# Patient Record
Sex: Female | Born: 1965 | Race: White | Hispanic: No | Marital: Married | Smoking: Former smoker
Health system: Southern US, Community
[De-identification: ages and names within clinical notes are randomized; demographics above are authoritative.]

## PROBLEM LIST (undated history)

## (undated) DIAGNOSIS — E119 Type 2 diabetes mellitus without complications: Secondary | ICD-10-CM

## (undated) DIAGNOSIS — J40 Bronchitis, not specified as acute or chronic: Secondary | ICD-10-CM

## (undated) DIAGNOSIS — E079 Disorder of thyroid, unspecified: Secondary | ICD-10-CM

## (undated) DIAGNOSIS — N390 Urinary tract infection, site not specified: Secondary | ICD-10-CM

## (undated) DIAGNOSIS — I10 Essential (primary) hypertension: Secondary | ICD-10-CM

## (undated) DIAGNOSIS — N289 Disorder of kidney and ureter, unspecified: Secondary | ICD-10-CM

## (undated) DIAGNOSIS — H269 Unspecified cataract: Secondary | ICD-10-CM

## (undated) DIAGNOSIS — M069 Rheumatoid arthritis, unspecified: Secondary | ICD-10-CM

## (undated) DIAGNOSIS — K760 Fatty (change of) liver, not elsewhere classified: Secondary | ICD-10-CM

## (undated) HISTORY — PX: TUBAL LIGATION: SHX77

## (undated) HISTORY — DX: Unspecified cataract: H26.9

## (undated) HISTORY — PX: CATARACT EXTRACTION: SUR2

## (undated) HISTORY — DX: Type 2 diabetes mellitus without complications: E11.9

## (undated) HISTORY — DX: Fatty (change of) liver, not elsewhere classified: K76.0

## (undated) HISTORY — DX: Bronchitis, not specified as acute or chronic: J40

## (undated) HISTORY — PX: SHOULDER SURGERY: SHX246

---

## 2007-11-15 ENCOUNTER — Other Ambulatory Visit: Admission: RE | Admit: 2007-11-15 | Discharge: 2007-11-15 | Payer: Self-pay | Admitting: Gynecology

## 2009-03-14 ENCOUNTER — Ambulatory Visit: Payer: Self-pay | Admitting: Internal Medicine

## 2009-03-14 DIAGNOSIS — F17200 Nicotine dependence, unspecified, uncomplicated: Secondary | ICD-10-CM | POA: Insufficient documentation

## 2009-03-18 LAB — CONVERTED CEMR LAB
ALT: 8 units/L (ref 0–35)
Basophils Absolute: 0 10*3/uL (ref 0.0–0.1)
Basophils Relative: 0 % (ref 0–1)
Calcium: 9.6 mg/dL (ref 8.4–10.5)
Cholesterol: 188 mg/dL (ref 0–200)
Glucose, Bld: 90 mg/dL (ref 70–99)
HDL: 45 mg/dL (ref >39)
Hemoglobin: 13.5 g/dL (ref 12.0–15.0)
Lymphocytes Relative: 34 % (ref 12–46)
Monocytes Absolute: 0.7 10*3/uL (ref 0.1–1.0)
Monocytes Relative: 8 % (ref 3–12)
Neutro Abs: 5.1 10*3/uL (ref 1.7–7.7)
Neutrophils Relative %: 56 % (ref 43–77)
Potassium: 4.3 meq/L (ref 3.5–5.3)
RBC: 4.44 M/uL (ref 3.87–5.11)
RDW: 13.7 % (ref 11.5–15.5)
Sodium: 138 meq/L (ref 135–145)
Total CHOL/HDL Ratio: 4.2
VLDL: 25 mg/dL (ref 0–40)

## 2009-08-20 ENCOUNTER — Ambulatory Visit: Payer: Self-pay | Admitting: Internal Medicine

## 2009-08-20 DIAGNOSIS — R635 Abnormal weight gain: Secondary | ICD-10-CM | POA: Insufficient documentation

## 2012-02-01 ENCOUNTER — Other Ambulatory Visit: Payer: Self-pay | Admitting: Obstetrics and Gynecology

## 2012-02-01 ENCOUNTER — Other Ambulatory Visit (HOSPITAL_COMMUNITY)
Admission: RE | Admit: 2012-02-01 | Discharge: 2012-02-01 | Disposition: A | Discharge status: Home / Self Care | Payer: 59 | Source: Ambulatory Visit | Attending: Obstetrics and Gynecology | Admitting: Obstetrics and Gynecology

## 2012-02-01 DIAGNOSIS — Z1159 Encounter for screening for other viral diseases: Secondary | ICD-10-CM | POA: Insufficient documentation

## 2012-02-01 DIAGNOSIS — Z124 Encounter for screening for malignant neoplasm of cervix: Secondary | ICD-10-CM | POA: Insufficient documentation

## 2015-07-12 ENCOUNTER — Encounter (HOSPITAL_BASED_OUTPATIENT_CLINIC_OR_DEPARTMENT_OTHER): Payer: Self-pay | Admitting: Emergency Medicine

## 2015-07-12 ENCOUNTER — Emergency Department (HOSPITAL_BASED_OUTPATIENT_CLINIC_OR_DEPARTMENT_OTHER): Payer: 59

## 2015-07-12 ENCOUNTER — Emergency Department (HOSPITAL_BASED_OUTPATIENT_CLINIC_OR_DEPARTMENT_OTHER)
Admission: EM | Admit: 2015-07-12 | Discharge: 2015-07-12 | Disposition: A | Discharge status: Home / Self Care | Payer: 59 | Attending: Emergency Medicine | Admitting: Emergency Medicine

## 2015-07-12 DIAGNOSIS — M545 Low back pain: Secondary | ICD-10-CM | POA: Diagnosis present

## 2015-07-12 DIAGNOSIS — Z8639 Personal history of other endocrine, nutritional and metabolic disease: Secondary | ICD-10-CM | POA: Diagnosis not present

## 2015-07-12 DIAGNOSIS — Z3202 Encounter for pregnancy test, result negative: Secondary | ICD-10-CM | POA: Diagnosis not present

## 2015-07-12 DIAGNOSIS — R52 Pain, unspecified: Secondary | ICD-10-CM

## 2015-07-12 DIAGNOSIS — M79604 Pain in right leg: Secondary | ICD-10-CM

## 2015-07-12 HISTORY — DX: Disorder of kidney and ureter, unspecified: N28.9

## 2015-07-12 HISTORY — DX: Disorder of thyroid, unspecified: E07.9

## 2015-07-12 LAB — CBC WITH DIFFERENTIAL/PLATELET
Basophils Absolute: 0 10*3/uL (ref 0.0–0.1)
Basophils Relative: 0 % (ref 0–1)
EOS PCT: 1 % (ref 0–5)
Eosinophils Absolute: 0.1 10*3/uL (ref 0.0–0.7)
HCT: 38.5 % (ref 36.0–46.0)
Hemoglobin: 12.6 g/dL (ref 12.0–15.0)
LYMPHS ABS: 2.7 10*3/uL (ref 0.7–4.0)
LYMPHS PCT: 29 % (ref 12–46)
MCH: 29.5 pg (ref 26.0–34.0)
MCHC: 32.7 g/dL (ref 30.0–36.0)
MCV: 90.2 fL (ref 78.0–100.0)
Monocytes Absolute: 0.8 10*3/uL (ref 0.1–1.0)
Monocytes Relative: 9 % (ref 3–12)
NEUTROS ABS: 5.6 10*3/uL (ref 1.7–7.7)
Neutrophils Relative %: 61 % (ref 43–77)
PLATELETS: 288 10*3/uL (ref 150–400)
RBC: 4.27 MIL/uL (ref 3.87–5.11)
RDW: 13.6 % (ref 11.5–15.5)
WBC: 9.3 10*3/uL (ref 4.0–10.5)

## 2015-07-12 LAB — BASIC METABOLIC PANEL
ANION GAP: 9 (ref 5–15)
BUN: 13 mg/dL (ref 6–20)
CHLORIDE: 104 mmol/L (ref 101–111)
CO2: 25 mmol/L (ref 22–32)
Calcium: 9.2 mg/dL (ref 8.9–10.3)
Creatinine, Ser: 0.78 mg/dL (ref 0.44–1.00)
GFR calc Af Amer: >60 mL/min (ref >60)
GFR calc non Af Amer: >60 mL/min (ref >60)
GLUCOSE: 125 mg/dL — AB (ref 65–99)
Potassium: 3.6 mmol/L (ref 3.5–5.1)
SODIUM: 138 mmol/L (ref 135–145)

## 2015-07-12 LAB — URINALYSIS, ROUTINE W REFLEX MICROSCOPIC
BILIRUBIN URINE: NEGATIVE
GLUCOSE, UA: NEGATIVE mg/dL
Ketones, ur: NEGATIVE mg/dL
NITRITE: NEGATIVE
PH: 5.5 (ref 5.0–8.0)
Protein, ur: NEGATIVE mg/dL
SPECIFIC GRAVITY, URINE: 1.027 (ref 1.005–1.030)
Urobilinogen, UA: 0.2 mg/dL (ref 0.0–1.0)

## 2015-07-12 LAB — URINE MICROSCOPIC-ADD ON

## 2015-07-12 LAB — PREGNANCY, URINE: PREG TEST UR: NEGATIVE

## 2015-07-12 MED ORDER — METHOCARBAMOL 500 MG PO TABS: 1000.0000 mg | ORAL_TABLET | Freq: Four times a day (QID) | ORAL | Status: DC | PRN

## 2015-07-12 MED ORDER — MORPHINE SULFATE 4 MG/ML IJ SOLN
4.0000 mg | Freq: Once | INTRAMUSCULAR | Status: AC
  Administered 2015-07-12: 4 mg via INTRAMUSCULAR
  Filled 2015-07-12: qty 1

## 2015-07-12 MED ORDER — HYDROCODONE-ACETAMINOPHEN 5-325 MG PO TABS
2.0000 | ORAL_TABLET | Freq: Once | ORAL | Status: AC
  Administered 2015-07-12: 2 via ORAL
  Filled 2015-07-12: qty 2

## 2015-07-12 MED ORDER — OXYCODONE-ACETAMINOPHEN 5-325 MG PO TABS: ORAL_TABLET | ORAL | Status: DC

## 2015-07-12 NOTE — ED Notes (Signed)
Pt asked to change into gown for exam

## 2015-07-12 NOTE — Discharge Instructions (Signed)
Take robaxin and/or percocet for breakthrough pain, do not drink alcohol, drive, care for children or perfom other critical tasks while taking robaxin and/or percocet.  Please follow with your primary care doctor in the next 2 days for a check-up. They must obtain records for further management.   Do not hesitate to return to the Emergency Department for any new, worsening or concerning symptoms.

## 2015-07-12 NOTE — ED Provider Notes (Signed)
CSN: [id number]     Arrival date & time 07/12/15  1554 History   First MD Initiated Contact with Patient 07/12/15 1657     Chief Complaint  Patient presents with  . Flank Pain     (Consider location/radiation/quality/duration/timing/severity/associated sxs/prior Treatment) HPI  Blood pressure 146/75, pulse 79, temperature 98.5 F (36.9 C), temperature source Oral, resp. rate 16, height 5\' 2"  (1.575 m), weight 150 lb (68.04 kg), last menstrual period 06/27/2015, SpO2 100 %.  Tracey Wood is a 49 y.o. female complaining of severe right lumbar back pain colicky in nature onset this a.m., no pain medication taken prior to arrival, she rates it 8 out of 10. It's exacerbated by palpation, no exacerbation with movement or position. Patient denies fever, chills, nausea, vomiting, change in bowel or bladder habits. Patient had microhematuria noticed by her PCP, she was evaluated by a urologist that noted erythematous spots on cystoscopy. Patient is planned biopsy next week. She was instructed not to take any NSAIDs this week in preparation.  Past Medical History  Diagnosis Date  . Thyroid disease   . Renal disorder    History reviewed. No pertinent past surgical history. History reviewed. No pertinent family history. History  Substance Use Topics  . Smoking status: Never Smoker   . Smokeless tobacco: Not on file  . Alcohol Use: No   OB History    No data available     Review of Systems  10 systems reviewed and found to be negative, except as noted in the HPI.  Allergies  Ciprofloxacin  Home Medications   Prior to Admission medications   Medication Sig Start Date End Date Taking? Authorizing Provider  methocarbamol (ROBAXIN) 500 MG tablet Take 2 tablets (1,000 mg total) by mouth 4 (four) times daily as needed (Pain). 07/12/15   Tracey Mogan, PA-C  oxyCODONE-acetaminophen (PERCOCET/ROXICET) 5-325 MG per tablet 1 to 2 tabs PO q6hrs  PRN for pain 07/12/15   Tracey Ricks Adalida Garver,  PA-C   BP 123/72 mmHg  Pulse 74  Temp(Src) 98.5 F (36.9 C) (Oral)  Resp 16  Ht 5\' 2"  (1.575 m)  Wt 150 lb (68.04 kg)  BMI 27.43 kg/m2  SpO2 100%  LMP 06/27/2015 Physical Exam  Constitutional: She is oriented to person, place, and time. She appears well-developed and well-nourished. No distress.  HENT:  Head: Normocephalic and atraumatic.  Mouth/Throat: Oropharynx is clear and moist.  Eyes: Conjunctivae and EOM are normal. Pupils are equal, round, and reactive to light.  Neck: Normal range of motion.  Cardiovascular: Normal rate, regular rhythm and intact distal pulses.   Pulmonary/Chest: Effort normal and breath sounds normal.  Abdominal: Soft. There is no tenderness.  Musculoskeletal: Normal range of motion. She exhibits tenderness.       Back:  No CVAT b/l  No point tenderness to percussion of lumbar spinal processes.  No TTP or paraspinal muscular spasm. Strength is 5 out of 5 to bilateral lower extremities at hip and knee; extensor hallucis longus 5 out of 5. Ankle strength 5 out of 5, no clonus, neurovascularly intact. No saddle anaesthesia. Patellar reflexes are 2+ bilaterally.       Neurological: She is alert and oriented to person, place, and time.  Skin: She is not diaphoretic.  Psychiatric: She has a normal mood and affect.  Nursing note and vitals reviewed.   ED Course  Procedures (including critical care time) Labs Review Labs Reviewed  URINALYSIS, ROUTINE W REFLEX MICROSCOPIC (NOT AT [facility]) - Abnormal; Notable for the  following:    Hgb urine dipstick MODERATE (*)    Leukocytes, UA SMALL (*)    All other components within normal limits  URINE MICROSCOPIC-ADD ON - Abnormal; Notable for the following:    Squamous Epithelial / LPF MANY (*)    Bacteria, UA MANY (*)    Crystals CA OXALATE CRYSTALS (*)    All other components within normal limits  BASIC METABOLIC PANEL - Abnormal; Notable for the following:    Glucose, Bld 125 (*)    All other components  within normal limits  PREGNANCY, URINE  CBC WITH DIFFERENTIAL/PLATELET    Imaging Review Ct Renal Stone Study  07/12/2015   CLINICAL DATA:  Acute right flank pain, hematuria  EXAM: CT ABDOMEN AND PELVIS WITHOUT CONTRAST  TECHNIQUE: Multidetector CT imaging of the abdomen and pelvis was performed following the standard protocol without IV contrast.  COMPARISON:  None.  FINDINGS: Lower chest:  Lung bases are clear.  Hepatobiliary: Moderate hepatic steatosis with focal fatty sparing.  Cholelithiasis, without associated inflammatory changes (series 2/image 24.  Pancreas: Within normal limits.  Spleen: Within normal limits.  Adrenals/Urinary Tract: Adrenal glands are within normal limits.  Kidneys are within normal limits.  No renal, ureteral, or bladder calculi.  No hydronephrosis.  Bladder is within normal limits.  Stomach/Bowel: Stomach is within normal limits.  No evidence of bowel obstruction.  Normal appendix.  Vascular/Lymphatic: No evidence of abdominal aortic aneurysm.  No suspicious abdominopelvic lymphadenopathy.  Reproductive: Uterus is within normal limits.  Bilateral ovaries are within normal limits.  Other: No abdominopelvic ascites.  Musculoskeletal: Mild degenerative changes the lower lumbar spine.  Limbus vertebrae at L3.  IMPRESSION: No renal, ureteral, or bladder calculi.  No hydronephrosis.  No evidence of bowel obstruction.  Normal appendix.  No CT findings to account for the patient's right abdominal pain.   Electronically Signed   By: Julian Hy M.D.   On: 07/12/2015 18:36     EKG Interpretation None      MDM   Final diagnoses:  Pain  Lumbar pain with radiation down right leg    Filed Vitals:   07/12/15 1601 07/12/15 1758  BP: 146/75 123/72  Pulse: 79 74  Temp: 98.5 F (36.9 C)   TempSrc: Oral   Resp: 16 16  Height: 5\' 2"  (1.575 m)   Weight: 150 lb (68.04 kg)   SpO2: 100% 100%    Medications  morphine 4 MG/ML injection 4 mg (not administered)    HYDROcodone-acetaminophen (NORCO/VICODIN) 5-325 MG per tablet 2 tablet (2 tablets Oral Given 07/12/15 1744)    Tracey Wood is a pleasant 49 y.o. female presenting with right low back pain colicky in very severe. Patient is being evaluated by neurology for abnormal lesions in the bladder. She has urinary symptoms, no systemic signs of infection. Her urinalysis does have a moderate amount of hemoglobin, there are many bacteria however it is extremely contaminated. Will check basic blood work and CT.   UA, blood work and CT reassuring. Patient reassessed and she states that her pain has become less severe but is also now more constant and radiates down the right leg. Is a musculoskeletal pain with a component of sciatica. Patient will be given morphine IM and advised to follow closely with her primary care, will write prescriptions for Percocet and Robaxin  Evaluation does not show pathology that would require ongoing emergent intervention or inpatient treatment. Pt is hemodynamically stable and mentating appropriately. Discussed findings and plan with patient/guardian, who  agrees with care plan. All questions answered. Return precautions discussed and outpatient follow up given.   New Prescriptions   METHOCARBAMOL (ROBAXIN) 500 MG TABLET    Take 2 tablets (1,000 mg total) by mouth 4 (four) times daily as needed (Pain).   OXYCODONE-ACETAMINOPHEN (PERCOCET/ROXICET) 5-325 MG PER TABLET    1 to 2 tabs PO q6hrs  PRN for pain         Monico Blitz, PA-C 07/12/15 1856  Malvin Johns, MD 07/12/15 1947

## 2015-07-12 NOTE — ED Notes (Signed)
rx x 2 given for robaxin and percocet- d/c home with ride

## 2015-07-12 NOTE — ED Notes (Signed)
Patient is having right sided flank pain. The patient reports that she was recently at the urologist and has red spots in her bladder.

## 2016-03-03 DIAGNOSIS — R0989 Other specified symptoms and signs involving the circulatory and respiratory systems: Secondary | ICD-10-CM | POA: Insufficient documentation

## 2016-03-03 DIAGNOSIS — R09A2 Foreign body sensation, throat: Secondary | ICD-10-CM | POA: Insufficient documentation

## 2016-03-11 DIAGNOSIS — N926 Irregular menstruation, unspecified: Secondary | ICD-10-CM | POA: Insufficient documentation

## 2016-03-11 DIAGNOSIS — R6882 Decreased libido: Secondary | ICD-10-CM | POA: Insufficient documentation

## 2016-05-19 DIAGNOSIS — R7989 Other specified abnormal findings of blood chemistry: Secondary | ICD-10-CM | POA: Insufficient documentation

## 2016-05-19 DIAGNOSIS — Z0000 Encounter for general adult medical examination without abnormal findings: Secondary | ICD-10-CM | POA: Insufficient documentation

## 2016-05-19 DIAGNOSIS — M0579 Rheumatoid arthritis with rheumatoid factor of multiple sites without organ or systems involvement: Secondary | ICD-10-CM | POA: Insufficient documentation

## 2016-05-19 DIAGNOSIS — N3000 Acute cystitis without hematuria: Secondary | ICD-10-CM | POA: Insufficient documentation

## 2016-06-21 ENCOUNTER — Encounter: Payer: Self-pay | Admitting: Gynecology

## 2016-06-21 ENCOUNTER — Ambulatory Visit (INDEPENDENT_AMBULATORY_CARE_PROVIDER_SITE_OTHER): Payer: 59 | Admitting: Gynecology

## 2016-06-21 VITALS — BP 124/80 | Ht 63.0 in | Wt 148.0 lb

## 2016-06-21 DIAGNOSIS — Z01419 Encounter for gynecological examination (general) (routine) without abnormal findings: Secondary | ICD-10-CM

## 2016-06-21 DIAGNOSIS — R102 Pelvic and perineal pain: Secondary | ICD-10-CM | POA: Diagnosis not present

## 2016-06-21 DIAGNOSIS — N912 Amenorrhea, unspecified: Secondary | ICD-10-CM | POA: Diagnosis not present

## 2016-06-21 DIAGNOSIS — R232 Flushing: Secondary | ICD-10-CM

## 2016-06-21 DIAGNOSIS — N951 Menopausal and female climacteric states: Secondary | ICD-10-CM | POA: Diagnosis not present

## 2016-06-21 NOTE — Addendum Note (Signed)
Addended by: Burnett Kanaris on: 06/21/2016 03:01 PM   Modules accepted: Orders, SmartSet

## 2016-06-21 NOTE — Patient Instructions (Signed)
Terapia de reemplazo hormonal (Hormone Therapy) En la menopausia, su cuerpo comienza a producir menos estrgeno y Immunologist. Esto provoca que el cuerpo deje de tener perodos [place]. Esto se debe a que el estrgeno y la progesterona controlan sus perodos y su ciclo menstrual. Ardelia Mems falta de estrgeno puede causar sntomas tales como:  Social research officer, government.  Sequedad vaginal  Piel seca.  Prdida del deseo sexual.  Riesgo de prdida de hueso (osteoporosis). Cuando esto ocurre, puede elegir realizar Ardelia Mems terapia hormonal para volver a Clinical research associate estrgeno perdido [employer]. Cuando slo se introduce esta hormona, el procedimiento se conoce normalmente como TRE (terapia de reemplazo de [place]). Cuando la hormona progestina se combina con el estrgeno, el procedimiento se conoce normalmente como TH (terapia hormonal). Esto es lo que previamente se conoca como terapia de reemplazo hormonal (TRH). El profesional que le asiste le ayudar a tomar una decisin acerca de lo que resulte lo mejor para usted. La decisin de realizar una TRH cambia a menudo debido a que se Risk manager. Muchos estudios no ponen de acuerdo con respecto a los beneficios de Optometrist una terapia de reemplazo hormonal.  BENEFICIOS PROBABLES DE LA TRH QUE INCLUYEN PROTECCIN CONTRA:  Golpes de calor - Un golpe de calor es la sensacin repentina de calor sobre la cara y el cuerpo. La piel enrojece, como al sonrojarse. Estn asociados con la transpiracin y los trastornos del sueo. Las mujeres que atraviesan la menopausia pueden tener golpes de calor unas pocas veces en el mes o varios al da; esto depende de la mujer.  Osteoporosis (prdida de hueso) - El estrgeno ayuda a protegerse contra la prdida de [place]. Luego de la [place], los huesos de una mujer pierden calcio y se vuelven frgiles y Publishing rights manager. Como resultado, es ms probable que el hueso se [place]. Los que resultan afectados con mayor  frecuencia son los de la cadera, la Ansted y la columna vertebral. La terapia hormonal puede ayudar a retardar la prdida de hueso luego de la menopausia. Realizar ejercicios con peso y tomar calcio con vitamina D tambin puede ayudar a prevenir la prdida de [place]. Existen medicamentos que puede prescribir el profesional que la asiste para ayudar a prevenir la osteoporosis.  Sequedad vaginal - La prdida de estrgeno produce cambios en la vagina. El recubrimiento de la misma puede volverse fino y Education officer, museum. Estos cambios pueden causar dolor y [place]. La sequedad tambin puede producir una infeccin. Puede ocasionarle ardor y [place].  Las infecciones en las vas urinarias son ms comunes luego de la menopausia debido a la falta de Oceanographer.  Otros beneficios posibles del estrgeno incluyen un cambio positivo en el humor y en la memoria de corto plazo en las mujeres. EFECTOS SECUNDARIOS Y RIESGOS  Utilizar estrgeno slo sin progesterona causa que el recubrimiento del tero crezca. Esto aumenta el riesgo de cncer endometrial. El profesional que la asiste deber darle otra hormona llamada progestina, si usted tiene tero.  Las mujeres que realizan una TH combinada (estrgeno y progestina) parecen tener un mayor riesgo de sufrir cncer de mama. El riesgo parece ser [place], [place] aumenta a lo largo del tiempo que se realice la [place].  La terapia combinada tambin hace que el tejido mamario sea levemente ms denso, lo que hace que sea ms difcil leer mamografas (radiografas de mama).  Combinada, la terapia de estrgeno y Immunologist puede realizarse todos los das, en cuyo caso podrn Warehouse manager de [place]. La TH puede realizarse de  manera cclica, en cuyo caso tendr perodos menstruales.  La TH puede aumentar el riesgo de [place], ataque cardaco, cncer de mama y formacin de cogulos en la pierna.  El estrgeno transdrmico (estrgeno que se absorbe a Designer, jewellery  de la piel mediante el uso de un parche o una crema) puede tener mejores resultados en los siguientes casos:  Colesterol.  La presin arterial.  Cogulos sanguneos. La presencia de estas afecciones puede indicar que no debe realizarse terapia hormonal:  Cncer de endometrio.  Enfermedad heptica.  Cncer de mama.  Cardiopata.  Antecedentes de cogulos sanguneos.  Ictus. TRATAMIENTO  Si decide realizar Ardelia Mems TH y tiene tero, normalmente se prescribe el uso de estrgeno y progestina.  El profesional que la asiste le ayudar a decidir la mejor forma de [employer].  [employer] formas posibles de administracin de [place], se incluyen las siguientes:  Pldoras.  Parches.  Geles.  Aerosoles.  Crema, anillos y vulos vaginales con estrgeno.  Lo mejor es Chief Executive Officer dosis posible que pueda ayudarla con sus sntomas y tomarlos durante la menor cantidad de tiempo posible.  La terapia hormonal puede ayudar a Banker de los problemas (sntomas) que afectan a las mujeres durante la menopausia. Antes de tomar una decisin con respecto a la TH, converse con el profesional que la asiste acerca de qu es lo mejor para usted. Mantngase bien informada y sintase cmoda con sus decisiones. INSTRUCCIONES PARA EL CUIDADO DOMICILIARIO:  [place] indicaciones del profesional con respecto a cmo Biomedical scientist.  Se realiza una prueba de Papanicolaou para detectar el cncer de cuello del tero.  El primer Papanicolaou debe realizarse a los 21 aos.  La prueba se repite cada 2 aos entre los 21 y los 29 aos.  Despus de los 30 aos, debe realizarse una prueba de Papanicolaou cada 3 aos siempre que los 3 estudios anteriores hayan sido normales.  Algunas mujeres presentan problemas mdicos que aumentan la probabilidad de Museum/gallery curator cncer de cuello del tero. Consulte a su mdico acerca de estos problemas. Es muy importante que le informe a su mdico si  aparecen nuevos problemas poco despus de su ltimo Papanicolaou. En estos casos, el mdico podr indicar que se realice el Papanicolaou con ms frecuencia.  Las [employer] son las mismas para las mujeres que hayan recibido o no la vacuna para el VPH (virus del papiloma humano).  Si le han realizado una histerectoma por un problema que no era cncer o una afeccin que pudiera causar cncer, ya no necesitar un Papanicolaou. Sin embargo, aunque ya no necesite hacerse un Papanicolaou, es una buena idea hacerse un examen regularmente para asegurarse de que no haya otros problemas.  Si tiene entre 34 y 39 aos y ha tenido [employer] estudios de Papanicolaou en los ltimos 10 aos, ya no ser IT sales professional. Sin embargo, aunque ya no necesite hacerse un Papanicolaou, es una buena idea hacerse un examen regularmente para asegurarse de que no haya otros problemas.  Si ha recibido un tratamiento para Science writer cervical o una enfermedad que podra causar cncer, necesitar realizarse una prueba de Papanicolaou y controles durante al menos 64 aos de concluido el [place].  Si no se ha [employer] con regularidad, Chief Executive Officer a [employer] factores de riesgo (como el tener un nuevo compaero sexual) para Teacher, adult education si es necesario que se los haga de [place].  Es posible que algunas mujeres deban realizarse exmenes de deteccin con mayor frecuencia  si presentan un alto riesgo de padecer cncer de cuello del tero.  Hgase controles de [place] regular, e incluya Papanicolau y [place]. SOLICITE ATENCIN MDICA DE INMEDIATO SI PRESENTA:  Hemorragia vaginal anormal.  Dolor o inflamacin en las piernas, falta de aliento o Tourist information centre manager.  Mareos o dolores de [place].  Protuberancias o cambios en sus mamas o axilas.  Pronunciacin inarticulada.  Debilidad o adormecimiento en los brazos o las piernas.  Dolor, ardor o sangrado al [employer].  Dolor  abdominal.   Esta informacin no tiene como fin reemplazar el consejo del mdico. Asegrese de hacerle al mdico cualquier pregunta que tenga.   Document Released: 05/31/2008 Document Revised: 04/29/2015 Elsevier Interactive Patient Education 2016 [place] menopausia (Menopause) La menopausia es el perodo normal de la vida en el cual los perodos menstruales cesan por completo. Se completa cuando no se tienen 12 perodos menstruales consecutivos. Ocurre generalmente en mujeres entre los 40 y los 23 aos. Muy rara vez una mujer tiene la menopausia antes de los 40 [place]. En la menopausia, los ovarios dejan de producir las hormonas femeninas estrgeno y Immunologist. Esto puede causar sntomas indeseables y Technical brewer a Technical sales engineer. A veces los sntomas aparecen entre 4 y 5 aos antes de que comience la menopausia. No existe una relacin [employer] y:  [place] anticonceptivos orales.  La cantidad de hijos que tuvo.  La raza.  La edad en que comenzaron los perodos menstruales (menarca). Las mujeres que fuman mucho y las que son muy delgadas pueden desarrollar la menopausia a una edad ms temprana. CAUSAS  Los ovarios dejan de producir las hormonas femeninas estrgeno y Immunologist.  Otras causas son:  [place] en la que se extirpan ambos ovarios.  Los ovarios que dejan de funcionar sin un motivo conocido.  Tumores de la glndula pituitaria en el cerebro.  Enfermedades que [employer] Airlines ovarios y la produccin de hormonas.  Radioterapia en el abdomen o la pelvis.  Quimioterapia que afecte a los ovarios. SNTOMAS   Acaloramiento.  Sudoracin nocturna.  Disminucin del deseo sexual.  Sequedad vaginal y adelgazamiento de la vagina, lo que causa relaciones sexuales dolorosas.  Sequedad de la piel y aparicin de Glass blower/designer.  Dolores de [place].  Cansancio.  Irritabilidad.  Problemas de memoria.  Aumento de [place].  Infeccin de la vejiga.  Aparicin de vello  en el rostro y [place].  Infertilidad. Los sntomas ms graves pueden incluir:  Prdida de masa sea osteoporosis) que favorece la rotura de huesos(fracturas).  Depresin.  Endurecimiento y Public librarian de las arterias (aterosclerosis) lo que puede causar infarto e ictus. DIAGNSTICO   El perodo menstrual no aparece por 12 meses seguidos.  Examen fsico.  Estudios hormonales de Herbalist. TRATAMIENTO  Hay muchas opciones de tratamiento y casi tantas dudas como opciones existen. La decisin de tratar o no los cambios que trae la menopausia es una decisin que realiza el profesional de acuerdo con cada Advertising copywriter. El Management consultant las opciones de tratamiento con usted. Juntos podrn decidir qu tratamiento es el mejor para usted. Las opciones de tratamiento pueden incluir:   Terapia hormonal (estrgenos y Immunologist).  Medicamentos no hormonales.  Tratamiento de los sntomas individuales con medicamentos (por ejemplo antidepresivos para la depresin).  Algunos medicamentos herbales pueden ayudar en sntomas especficos.  Psicoterapia con un psiclogo o psiquiatra.  Terapia grupal.  Cambios en el estilo de vida, como:  Consumir una dieta saludable.  Actividad fsica regular.  Limitar el consumo  de cafena y alcohol.  Control del estrs y Public affairs consultant.  No realizar tratamiento. [place] todos los medicamentos como el mdico le indique.  Descanse y duerma lo suficiente.  Haga ejercicios regularmente.  Consuma una dieta que contenga calcio (bueno para los [place]) y productos derivados de la soja (actan como estrgenos).  Evite las bebidas alcohlicas.  No fume.  Si tiene sofocones, vstase en capas.  Tome suplementos de calcio y vitamina D para fortalecer los [place].  Puede utilizar lubricantes y humectantes de venta libre para la sequedad vaginal.  En algunos casos la terapia de grupo podr  ayudarla.  La acupuntura puede ser de ayuda en ciertos casos. SOLICITE ATENCIN MDICA SI:   No est segura de estar en la menopausia.  Tiene sntomas de [place] y [place] asesoramiento y Clinical research associate.  Tiene 40 aos y an tiene Chartered certified accountant.  Tiene dolor durante las [employer].  La menopausia se ha completado (no ha tenido el perodo menstrual durante 12 meses) y tiene un sangrado vaginal.  Necesita ser derivada a un especialista (gineclogo, psiquiatra, o psiclogo) para Arts administrator. SOLICITE ATENCIN MDICA DE INMEDIATO SI:   Siente una depresin intensa e incontrolable.  Tiene una hemorragia vaginal abundante.  Siente y cree que se ha fracturado un hueso.  Siente dolor al [employer].  Siente dolor en el pecho o en la pierna.  Tiene latidos cardacos rpidos (palpitaciones).  Siente dolor de cabeza intenso.  Tiene problemas de visin.  Siente un bulto en la mama.  Siente un dolor abdominal intenso o indigestin.   Esta informacin no tiene Marine scientist el consejo del mdico. Asegrese de hacerle al mdico cualquier pregunta que tenga.   Document Released: 01/24/2007 Document Revised: 08/15/2013 Elsevier Interactive Patient Education [employer].

## 2016-06-21 NOTE — Progress Notes (Signed)
Tracey Wood 11/21/1966 [id number]   History:    50 y.o.  for annual gyn exam who has not been seen the office for several years due to insurance issues. She stated she had physical exam done at [facility]. She is being followed for hypothyroidism as well as rheumatoid arthritis. She also has vitamin D deficiency for which she's currently taking 50,000 units every weekly for 12 weeks before she gets her levels rechecked. She also had her complete blood work done by her internist office. She is complaining of skipping cycles up to 2-3 months at a time and started to experience vasomotor symptoms. She stated that her mother reached the menopause at the age of 47. She stated that she went to [place] last year she takes some natural products once a year for her thyroid so she is currently not taking any Synthroid extract here in the [place]. She stated her thyroid function tests were normal recently at time of her annual blood work at her PCP office. She states her mammogram is less than a year ago. Patient reports no prior history of any abnormal Pap smears.  Past medical history,surgical history, family history and social history were all reviewed and documented in the EPIC chart.  Gynecologic History Patient's last menstrual period was 05/05/2016. Contraception: tubal ligation Last Pap: Several years ago. Results were: normal Last mammogram: Less than a year ago. Results were: Normal  Obstetric History OB History  Gravida Para Term Preterm AB SAB TAB Ectopic Multiple Living  0 0 0 0 0 0 0 0 0 0          ROS: A ROS was performed and pertinent positives and negatives are included in the history.  GENERAL: No fevers or chills. HEENT: No change in vision, no earache, sore throat or sinus congestion. NECK: No pain or stiffness. CARDIOVASCULAR: No chest pain or pressure. No palpitations. PULMONARY: No shortness of breath, cough or wheeze.  GASTROINTESTINAL: No abdominal pain, nausea, vomiting or diarrhea, melena or bright red blood per rectum. GENITOURINARY: No urinary frequency, urgency, hesitancy or dysuria. MUSCULOSKELETAL: No joint or muscle pain, no back pain, no recent trauma. DERMATOLOGIC: No rash, no itching, no lesions. ENDOCRINE: No polyuria, polydipsia, no heat or cold intolerance. No recent change in weight. HEMATOLOGICAL: No anemia or easy bruising or bleeding. NEUROLOGIC: No headache, seizures, numbness, tingling or weakness. PSYCHIATRIC: No depression, no loss of interest in normal activity or change in sleep pattern.     Exam: chaperone present  BP 124/80 mmHg  Ht 5\' 3"  (1.6 m)  Wt 148 lb (67.132 kg)  BMI 26.22 kg/m2  LMP 05/05/2016  Body mass index is 26.22 kg/(m^2).  General appearance : Well developed well nourished female. No acute distress HEENT: Eyes: no retinal hemorrhage or exudates,  Neck supple, trachea midline, no carotid bruits, no thyroidmegaly Lungs: Clear to auscultation, no rhonchi or wheezes, or rib retractions  Heart: Regular rate and rhythm, no murmurs or gallops Breast:Examined in sitting and supine position were symmetrical in appearance, no palpable masses or tenderness,  no skin retraction, no nipple inversion, no nipple discharge, no skin discoloration, no axillary or supraclavicular lymphadenopathy Abdomen: no palpable masses or tenderness, no rebound or guarding Extremities: no edema or skin discoloration or tenderness  Pelvic:  Bartholin, Urethra, Skene Glands: Within normal limits             Vagina: No gross lesions or discharge  Cervix: No gross lesions or discharge  Uterus  anteverted, normal size, shape and consistency, non-tender and mobile  Adnexa  tenderness left adnexa questionable ovarian cyst  Anus and perineum  normal   Rectovaginal  normal sphincter tone without palpated masses or tenderness             Hemoccult not indicated     Assessment/Plan:  50 y.o. female  for annual exam who has signs and symptoms consistent with possible perimenopause/menopause. We will check an [facility] today. We'll also going to check a prolactin level as well as her TSH was checked recently by her PCP reported to be normal according to the patient. She will return back to the office later this week for an ultrasound for better assessment of her ovaries and she's been having some lower abdominal discomfort. We'll discuss the results of her blood work. I provided her with literature information Spanish on the menopause and hormone replacement therapy. Pap smear with HPV screening was done today.   Terrance Mass MD, 2:46 PM 06/21/2016

## 2016-06-22 LAB — FOLLICLE STIMULATING HORMONE: FSH: 3.1 m[IU]/mL

## 2016-06-22 LAB — PROLACTIN: PROLACTIN: 4.6 ng/mL

## 2016-06-23 LAB — PAP, TP IMAGING W/ HPV RNA, RFLX HPV TYPE 16,18/45: HPV MRNA, HIGH RISK: NOT DETECTED

## 2016-07-30 ENCOUNTER — Encounter: Payer: Self-pay | Admitting: Gynecology

## 2016-07-30 ENCOUNTER — Ambulatory Visit (INDEPENDENT_AMBULATORY_CARE_PROVIDER_SITE_OTHER): Payer: 59 | Admitting: Gynecology

## 2016-07-30 ENCOUNTER — Ambulatory Visit (INDEPENDENT_AMBULATORY_CARE_PROVIDER_SITE_OTHER): Payer: 59

## 2016-07-30 VITALS — BP 124/78

## 2016-07-30 DIAGNOSIS — R6882 Decreased libido: Secondary | ICD-10-CM | POA: Diagnosis not present

## 2016-07-30 DIAGNOSIS — D251 Intramural leiomyoma of uterus: Secondary | ICD-10-CM | POA: Insufficient documentation

## 2016-07-30 DIAGNOSIS — N912 Amenorrhea, unspecified: Secondary | ICD-10-CM

## 2016-07-30 DIAGNOSIS — R102 Pelvic and perineal pain: Secondary | ICD-10-CM

## 2016-07-30 NOTE — Progress Notes (Signed)
   Patient is a 50 year old was seen in the office this past June she not been seen the office for several years due to insurance company issues. Her PCP has been doing her labs in the past. She sometimes skips cycles and was wondering she was perimenopausal she had a normal FSH and TSH. She was also complaining of low abdominal discomfort and for this reason she is here to discuss her ultrasound. She was in no acute distress today. She also brought of the issue that she has decreased sexual desire and needed some help. Patient with previous tubal ligation.  Ultrasound: Uterus measuring 9.2 x 5.6 x 4.7 cm with endometrial stripe of 6.4 mm. The posterior subserous fibroid measuring 11 x 14 x 14 mm was noted. Right left or otherwise normal.   We had a lengthy discussion today on hypoactive sexual disorder to include different treatment options. We discussed today  Addyi which is ideal for individual such as her was not yet menopausal who has low sexual desire. She has no mental or medical issues. She has a stable relationship and is not taking any other medication that may be affecting her libido.   We discussed that this is a nonhormonal prescription treatment she would take 1 pill by mouth at bedtime. If she is not noticing any results by 8 weeks she should discontinue it. We discussed potential side effects to include dizziness, tiredness, nausea and dry mouth and difficulty following asleep. Discussed avoidance of alcohol. We discussed avoiding any herbal supplement. Literature information was provided.  Assessment/plan: #1 small intramural fibroid otherwise normal ultrasound #2 hypoactive sexual desire disorder (HSDD) will be treated with Addyi 100 mg tablet at bedtime. She'll be prescribed one month supply with 11 refills.  Per the 90% of time was spent counseling and correlating care for this patient. Additional time of consultation 50 minutes.

## 2016-07-30 NOTE — Patient Instructions (Signed)
Flibanserin oral tablets Qu es este medicamento? FLIBANSERINA se [place] para tratar el trastorno de deseo sexual hipoactivo (bajo) (TDSH) en mujeres que no han pasado por la menopausia, que no han tenido un deseo sexual bajo en el pasado, y que tienen un deseo sexual bajo que no importa el tipo de actividad sexual, la situacin o la pareja sexual. Las mujeres con [place] tienen un deseo sexual bajo que es preocupante para ellas, y no se debe a un problema mdico o de salud mental, problemas en la relacin, medicamentos o abuso de drogas. Este medicamento no es para TDSH en mujeres que han pasado por la menopausia. Este medicamento no es para los hombres. Este medicamento no es para usar para Garment/textile technologist desempeo sexual. Cindra Presume medicamento puede ser utilizado para otros usos; si tiene alguna pregunta consulte con su proveedor de atencin mdica o con su farmacutico. Qu le debo informar a mi profesional de la salud antes de tomar este medicamento? Necesita saber si usted presenta alguno de los siguientes problemas o situaciones: -deshidratacin -si consume alcohol -abuso de drogas o adiccin a drogas -enfermedad cardiaca -antecedentes de depresin u otros problemas de la salud mental -antecedentes de problemas de abuso de drogas o alcohol -enfermedad heptica -baja presin sangunea -una reaccin alrgica o inusual a la flibanserina, a otros medicamentos, alimentos, colorantes o conservantes -si est embarazada o buscando quedar embarazada -si est amamantando a un beb Cmo debo utilizar este medicamento? Tome este medicamento por va oral con un vaso de agua y sin comida. Siga las instrucciones de la etiqueta del [place]. Este medicamento slo se debe tomar a la hora de [place]. Si lo toma en un momento en que no sea la hora de dormir puede aumentar el riesgo de tener presin arterial baja, desmayo, lesin accidental, y Chiropractor. No lo tome con jugo de toronja. Tome su  medicamento a intervalos regulares. No tome su medicamento con una frecuencia mayor a la indicada. No deje de tomarlo excepto si as lo indica su mdico. Hable con su pediatra para informarse acerca del uso de este medicamento en nios. Este medicamento no es para Medical sales representative. Sobredosis: Pngase en contacto inmediatamente con un centro toxicolgico o una sala de urgencia si usted cree que haya tomado demasiado medicamento. ATENCIN: [employer] es solo para usted. No comparta este medicamento con nadie. Qu sucede si me olvido de una dosis? Si se olvida su dosis antes de acostarse, no tome la dosis [place] y tome la siguiente dosis a la hora de dormir al da siguiente. No tome este medicamento a la maana siguiente o doble la siguiente dosis. Qu puede interactuar con este medicamento? No tome esta medicina con ninguno de los siguientes medicamentos: -alcohol -amprenavir -atazanavir -boceprevir -ciprofloxacina -claritromicina -conivaptan -diltiazem -eritromicina -fluconazol -fosamprenavir -jugo de toronja -indinavir -itraconazol -quetoconazol -nefazodona -nelfinavir -posaconazol -ritonavir -saquinavir -telaprevir -telitromicina -verapamilo Esta medicina tambin puede interactuar con los siguientes medicamentos: -pldoras anticonceptivas -bupropion -ciertos medicamentos para la ansiedad o dormir -ciertos medicamentos para las convulsiones, tales como carbamazepina, fenobarbital, fenitona -ciertos medicamentos para problemas estomacales tales como cimetidina, esomeprazola, dexlansoprazol, lansoprazol, omeprazol, pantoprazol, rabeprazol, ranitidina -digoxina -difenhidramina -etravirina -fluoxetina -fluvoxamina -ginkgo biloba -lorcaserina -medicamentos narcticos para el dolor -nefazodona -resveratrol -rifabutina -rifampina -rifapentina -simvastatina -sirolims -hierba de [place] Puede ser que esta lista no menciona todas las posibles interacciones.  Informe a su profesional de [employer]	[place] de [employer] productos a base de hierbas, medicamentos de [place] o suplementos nutritivos que est tomando. Si usted  fuma, consume bebidas alcohlicas o si utiliza drogas ilegales, indqueselo tambin a su profesional de [employer]	[place]. Algunas sustancias pueden interactuar con su medicamento. A qu debo estar atento al usar [employer]? Visite a su mdico o a su profesional de la salud para chequear su evolucin peridicamente. Informe a su mdico si sus sntomas no han mejorado despus de haber tomado este medicamento durante 8 semanas. Puede experimentar mareos o somnolencia. No conduzca ni utilice maquinaria ni haga nada que le Risk manager en estado de alerta durante por lo menos 6 horas despus de tomar su dosis y Nurse, children's que sepa cmo le afecta este medicamento. El riesgo de somnolencia severa se aumenta si tambin est tomando otros medicamentos que causan somnolencia, o si usted toma este medicamento durante las horas de vigilia. Slo tome este medicamento a la hora de [place]. El alcohol puede aumentar los [employer] y Recruitment consultant, y puede aumentar el riesgo de baja presin sangunea o desmayos cuando se combina con este medicamento. No tome bebidas alcohlicas mientras est [employer]. No se ponga de pie ni se siente rpidamente. Esto reduce el riesgo de mareos o [employer]. Este medicamento puede causar presin sangunea baja, a veces con mareos y [place]. Si usted comienza a sentirse mareado o aturdido, Acupuncturist y Solicitor para ayuda si los sntomas no desaparecen. Consulte con su mdico o profesional de la salud si tiene un ataque de diarrea o vmito severo, o si suda mucho. La prdida excesiva de lquido corporal puede aumentar su riesgo de tener baja presin sangunea, mareos o desmayos. Este medicamento slo est disponible a travs de un programa restringido [employer] ADDYI REMS (su nombre en ingls), y slo se puede obtener a  travs de farmacias certificadas que participan en el programa. Para obtener ms informacin sobre el programa y una lista de farmacias que estn inscritos en el programa, vaya a [email or web address] o llame al [phone]-PINK-PILL [phone]). Qu efectos secundarios puedo tener al [employer] este medicamento? Efectos secundarios que debe informar a su mdico o a Barrister's clerk de la salud tan pronto como sea posible: -Chief of Staff como erupcin cutnea, picazn o urticarias, hinchazn de la cara, labios o lengua -somnolencia extrema -signos y sntomas de baja presin sangunea como mareos; sensacin de desmayos o aturdimiento, cadas; cansancio o debilidad inusual Efectos secundarios que, por lo general, no requieren atencin mdica (debe informarlos a su mdico o a su profesional de la salud si persisten o si son molestos): -estreimiento -somnolencia -boca seca -nuseas -dificultad para dormir Puede ser que esta lista no menciona todos los posibles efectos secundarios. Comunquese a su mdico por asesoramiento mdico [employer]. Usted puede informar los efectos secundarios a la FDA por telfono al 1-800-FDA-1088. Dnde debo guardar mi medicina? [place] fuera del alcance de los nios. [place] a [employer], entre 15 y 29 grados C (55 y 61 grados F). Deseche todo el medicamento que no haya utilizado, despus de la fecha de vencimiento. ATENCIN: Este folleto es un resumen. Puede ser que no cubra toda la posible informacin. Si usted tiene preguntas acerca de esta medicina, consulte con su mdico, su farmacutico o su profesional de Technical sales engineer.    2016, Elsevier/Gold Standard. (2015-02-05 00:00:00)

## 2016-11-16 DIAGNOSIS — M7582 Other shoulder lesions, left shoulder: Secondary | ICD-10-CM | POA: Insufficient documentation

## 2017-05-11 ENCOUNTER — Encounter: Payer: Self-pay | Admitting: Gynecology

## 2017-06-22 ENCOUNTER — Ambulatory Visit (INDEPENDENT_AMBULATORY_CARE_PROVIDER_SITE_OTHER): Payer: 59 | Admitting: Gynecology

## 2017-06-22 ENCOUNTER — Encounter: Payer: Self-pay | Admitting: Gynecology

## 2017-06-22 VITALS — BP 118/74 | Ht 64.0 in | Wt 151.0 lb

## 2017-06-22 DIAGNOSIS — Z01419 Encounter for gynecological examination (general) (routine) without abnormal findings: Secondary | ICD-10-CM | POA: Diagnosis not present

## 2017-06-22 DIAGNOSIS — E559 Vitamin D deficiency, unspecified: Secondary | ICD-10-CM

## 2017-06-22 NOTE — Progress Notes (Signed)
Tracey Wood 09-05-1966 [id number]   History:    51 y.o.  for annual gyn exam with no complaints today. Dr. Karle Starch is her PCP we'll be doing her blood work and she is currently under treatment for vitamin D deficiency. Patient with no previous history of any abnormal Pap smear. She reports normal menstrual cycles.  Past medical history,surgical history, family history and social history were all reviewed and documented in the EPIC chart.  Gynecologic History Patient's last menstrual period was 06/18/2017. Contraception: tubal ligation Last Pap: 2017. Results were: normal Last mammogram: 2017. Results were: normal  Obstetric History OB History  Gravida Para Term Preterm AB Living  0 0 0 0 0 0  SAB TAB Ectopic Multiple Live Births  0 0 0 0           ROS: A ROS was performed and pertinent positives and negatives are included in the history.  GENERAL: No fevers or chills. HEENT: No change in vision, no earache, sore throat or sinus congestion. NECK: No pain or stiffness. CARDIOVASCULAR: No chest pain or pressure. No palpitations. PULMONARY: No shortness of breath, cough or wheeze. GASTROINTESTINAL: No abdominal pain, nausea, vomiting or diarrhea, melena or bright red blood per rectum. GENITOURINARY: No urinary frequency, urgency, hesitancy or dysuria. MUSCULOSKELETAL: No joint or muscle pain, no back pain, no recent trauma. DERMATOLOGIC: No rash, no itching, no lesions. ENDOCRINE: No polyuria, polydipsia, no heat or cold intolerance. No recent change in weight. HEMATOLOGICAL: No anemia or easy bruising or bleeding. NEUROLOGIC: No headache, seizures, numbness, tingling or weakness. PSYCHIATRIC: No depression, no loss of interest in normal activity or change in sleep pattern.     Exam: chaperone present  BP 118/74   Ht 5\' 4"  (1.626 m)   Wt 151 lb (68.5 kg)   LMP 06/18/2017   BMI 25.92 kg/m   Body mass index is 25.92 kg/m.  General appearance : Well developed well nourished  female. No acute distress HEENT: Eyes: no retinal hemorrhage or exudates,  Neck supple, trachea midline, no carotid bruits, no thyroidmegaly Lungs: Clear to auscultation, no rhonchi or wheezes, or rib retractions  Heart: Regular rate and rhythm, no murmurs or gallops Breast:Examined in sitting and supine position were symmetrical in appearance, no palpable masses or tenderness,  no skin retraction, no nipple inversion, no nipple discharge, no skin discoloration, no axillary or supraclavicular lymphadenopathy Abdomen: no palpable masses or tenderness, no rebound or guarding Extremities: no edema or skin discoloration or tenderness  Pelvic:  Bartholin, Urethra, Skene Glands: Within normal limits             Vagina: No gross lesions or discharge  Cervix: No gross lesions or discharge  Uterus  anteverted, normal size, shape and consistency, non-tender and mobile  Adnexa  Without masses or tenderness  Anus and perineum  normal   Rectovaginal  normal sphincter tone without palpated masses or tenderness             Hemoccult not indicated     Assessment/Plan:  51 y.o. female for annual exam will sign a medical release form so that we can get a copy of her mammogram done at [facility] in [place] last year so that we can scanned into her records. Pap smear not indicated this year. We discussed importance of calcium along with her current vitamin D regimen that her PCP is placed her on along with weightbearing exercises for osteoporosis prevention.   Terrance Mass MD, 2:26 PM 06/22/2017

## 2017-06-22 NOTE — Patient Instructions (Signed)
Perimenopausia  (Perimenopause)  La perimenopausia es el momento en que su cuerpo comienza a pasar a la menopausia (sin menstruación durante 12 meses consecutivos). Es un proceso natural. La perimenopausia puede comenzar entre 2 y 8 años antes de la menopausia y por lo general tiene una duración de 1 año más pasada la menopausia. Durante este tiempo, los ovarios podrían producir un óvulo o no. Los ovarios varían su producción de las hormonas estrógeno y progesterona cada mes. Esto puede causar períodos menstruales irregulares, dificultad para quedar embarazada, hemorragia vaginal entre períodos y síntomas incómodos.  CAUSAS  · Producción irregular de las hormonas ováricas estrógeno y progesterona, y no ovular todos los meses.  · Otras causas son:  ? Tumor de la glándula pituitaria.  ? Enfermedades que afectan los ovarios.  ? Radioterapia.  ? Quimioterapia.  ? Causas desconocidas.  ? Fumar mucho y abusar del consumo de alcohol puede llevar a que la perimenopausia aparezca antes.    SIGNOS Y SÍNTOMAS  · Acaloramiento.  · Sudoración nocturna.  · Períodos menstruales irregulares.  · Disminución del deseo sexual.  · Sequedad vaginal.  · Dolores de cabeza.  · Cambios en el estado de ánimo.  · Depresión.  · Problemas de memoria.  · Irritabilidad.  · Cansancio.  · Aumento de peso.  · Problemas para quedar embarazada.  · Pérdida de células óseas (osteoporosis).  · Comienzo de endurecimiento de las arterias (aterosclerosis).    DIAGNÓSTICO  El médico realizará un diagnóstico en función de su edad, historial de períodos menstruales y síntomas. Le realizarán un examen físico para ver si hay algún cambio en su cuerpo, en especial en sus órganos reproductores. Las pruebas hormonales pueden ser o no útiles según la cantidad de hormonas femeninas que produzca y cuándo las produzca. Sin embargo, podrán realizarse otras pruebas hormonales para detectar otros problemas.  TRATAMIENTO   En algunos casos, no se necesita tratamiento. La decisión acerca de qué tratamiento es necesario durante la perimenopausia deberá realizarse en conjunto con su médico según cómo estén afectando los síntomas a su estilo de vida. Existen varios tratamientos disponibles, como:  · Tratar cada síntoma individual con medicamentos específicos para ese síntoma.  · Algunos medicamentos herbales pueden ayudar en síntomas específicos.  · Psicoterapia.  · Terapia grupal.  INSTRUCCIONES PARA EL CUIDADO EN EL HOGAR  · Controle sus periodos menstruales (cuándo ocurren, qué tan abundantes son, cuánto tiempo pasa entre períodos, y cuánto duran) como también sus síntomas y cuándo comenzaron.  · Tome sólo medicamentos de venta libre o recetados, según las indicaciones del médico.  · Duerma y descanse.  · Haga actividad física.  · Consuma una dieta que contenga calcio (bueno para los huesos) y productos derivados de la soja (actúan como estrógenos).  · No fume.  · Evite las bebidas alcohólicas.  · Tome los suplementos vitamínicos según las indicaciones del médico. En ciertos casos, puede ser de ayuda tomar vitamina E.  · Tome suplementos de calcio y vitamina D para ayudar a prevenir la pérdida ósea.  · En algunos casos la terapia de grupo podrá ayudarla.  · La acupuntura puede ser de ayuda en ciertos casos.    SOLICITE ATENCIÓN MÉDICA SI:  · Tiene preguntas acerca de sus síntomas.  · Necesita ser derivada a un especialista (ginecólogo, psiquiatra, o psicólogo).    SOLICITE ATENCIÓN MÉDICA DE INMEDIATO SI:  · Sufre una hemorragia vaginal abundante.  · Su período menstrual dura más de 8 días.  · Sus períodos son recurrentes   cada menos de 21 días.  · Tiene hemorragias durante las relaciones sexuales.  · Está muy deprimido.  · Siente dolor al orinar.  · Siente dolor de cabeza intenso.  · Tiene problemas de visión.    Esta información no tiene como fin reemplazar el consejo del médico.  Asegúrese de hacerle al médico cualquier pregunta que tenga.  Document Released: 12/13/2005 Document Revised: 10/03/2013 Document Reviewed: 07/12/2013  Elsevier Interactive Patient Education © 2017 Elsevier Inc.

## 2018-06-23 ENCOUNTER — Encounter: Payer: Self-pay | Admitting: Obstetrics & Gynecology

## 2018-06-23 ENCOUNTER — Ambulatory Visit (INDEPENDENT_AMBULATORY_CARE_PROVIDER_SITE_OTHER): Payer: Managed Care, Other (non HMO) | Admitting: Obstetrics & Gynecology

## 2018-06-23 VITALS — BP 126/78 | Ht 63.0 in | Wt 151.0 lb

## 2018-06-23 DIAGNOSIS — Z9851 Tubal ligation status: Secondary | ICD-10-CM | POA: Diagnosis not present

## 2018-06-23 DIAGNOSIS — N920 Excessive and frequent menstruation with regular cycle: Secondary | ICD-10-CM | POA: Diagnosis not present

## 2018-06-23 DIAGNOSIS — Z01419 Encounter for gynecological examination (general) (routine) without abnormal findings: Secondary | ICD-10-CM | POA: Diagnosis not present

## 2018-06-23 LAB — CBC
HCT: 38.5 % (ref 35.0–45.0)
Hemoglobin: 12.6 g/dL (ref 11.7–15.5)
MCH: 28.4 pg (ref 27.0–33.0)
MCHC: 32.7 g/dL (ref 32.0–36.0)
MCV: 86.9 fL (ref 80.0–100.0)
MPV: 9.2 fL (ref 7.5–12.5)
Platelets: 326 10*3/uL (ref 140–400)
RBC: 4.43 10*6/uL (ref 3.80–5.10)
RDW: 13.2 % (ref 11.0–15.0)
WBC: 9.3 10*3/uL (ref 3.8–10.8)

## 2018-06-23 NOTE — Progress Notes (Signed)
Tracey Wood 08-May-1966 [id number]   History:    52 y.o. G0 Married. S/P TL.  Accompanied by her adopted daughter who is [redacted] wks pregnant, expecting a baby girl.  RP:  Established patient presenting for annual gyn exam   HPI: Menstrual periods every month since last year, no missed period x last visit.  Heavier flow with overflow and blood clots on the first 2 days of each cycle.  No BTB.  No pelvic pain.  Normal vaginal secretions.  No pain with IC.  Urine/BMs wnl.  Breasts wnl.  BMI 26.75.  Not physically active.  Health labs with Fam MD.  Past medical history,surgical history, family history and social history were all reviewed and documented in the EPIC chart.  Gynecologic History Patient's last menstrual period was 06/07/2018. Contraception: tubal ligation Last Pap: 05/2016. Results were: Negative, HPV HR neg Last mammogram: 09/2017. Results were: normal per patient, will obtain report from [facility]. Bone Density: Never Colonoscopy: Will schedule through her Fam MD at 66  Obstetric History OB History  Gravida Para Term Preterm AB Living  0 0 0 0 0 0  SAB TAB Ectopic Multiple Live Births  0 0 0 0       ROS: A ROS was performed and pertinent positives and negatives are included in the history.  GENERAL: No fevers or chills. HEENT: No change in vision, no earache, sore throat or sinus congestion. NECK: No pain or stiffness. CARDIOVASCULAR: No chest pain or pressure. No palpitations. PULMONARY: No shortness of breath, cough or wheeze. GASTROINTESTINAL: No abdominal pain, nausea, vomiting or diarrhea, melena or bright red blood per rectum. GENITOURINARY: No urinary frequency, urgency, hesitancy or dysuria. MUSCULOSKELETAL: No joint or muscle pain, no back pain, no recent trauma. DERMATOLOGIC: No rash, no itching, no lesions. ENDOCRINE: No polyuria, polydipsia, no heat or cold intolerance. No recent change in weight. HEMATOLOGICAL: No anemia or easy bruising or bleeding. NEUROLOGIC: No  headache, seizures, numbness, tingling or weakness. PSYCHIATRIC: No depression, no loss of interest in normal activity or change in sleep pattern.     Exam:   BP 126/78   Ht 5\' 3"  (1.6 m)   Wt 151 lb (68.5 kg)   LMP 06/07/2018   BMI 26.75 kg/m   Body mass index is 26.75 kg/m.  General appearance : Well developed well nourished female. No acute distress HEENT: Eyes: no retinal hemorrhage or exudates,  Neck supple, trachea midline, no carotid bruits, no thyroidmegaly Lungs: Clear to auscultation, no rhonchi or wheezes, or rib retractions  Heart: Regular rate and rhythm, no murmurs or gallops Breast:Examined in sitting and supine position were symmetrical in appearance, no palpable masses or tenderness,  no skin retraction, no nipple inversion, no nipple discharge, no skin discoloration, no axillary or supraclavicular lymphadenopathy Abdomen: no palpable masses or tenderness, no rebound or guarding Extremities: no edema or skin discoloration or tenderness  Pelvic: Vulva: Normal             Vagina: No gross lesions or discharge  Cervix: No gross lesions or discharge.  Pap reflex done.  Uterus  AV, normal size, shape and consistency, non-tender and mobile  Adnexa  Without masses or tenderness  Anus: Normal   Assessment/Plan:  52 y.o. female for annual exam   1. Encounter for routine gynecological examination with Papanicolaou smear of cervix Normal gynecologic exam.  Pap reflex done today.  Breast exam normal.  Last screening mammogram was in October 2018, normal results per patient.  Will obtain  report from [facility].  Health labs with family physician.  Schedule screening colonoscopy at age 63.  2. Tubal ligation status  3. Menorrhagia with regular cycle Progressive menorrhagia with regular cycles.  Rule out anemia with a CBC today.  Patient will follow-up with a pelvic ultrasound to rule out pathology, including endometrial polyps, uterine fibroids, endometrial hyperplasia and  endometrial cancer. - CBC - US Transvaginal Non-OB; Future  Counseling on above issues and coordination of care >50% x 10 minutes  [name] MD, 2:47 PM 06/23/2018

## 2018-06-25 ENCOUNTER — Encounter: Payer: Self-pay | Admitting: Obstetrics & Gynecology

## 2018-06-25 NOTE — Patient Instructions (Signed)
1. Encounter for routine gynecological examination with Papanicolaou smear of cervix Normal gynecologic exam.  Pap reflex done today.  Breast exam normal.  Last screening mammogram was in October 2018, normal results per patient.  Will obtain report from [facility].  Health labs with family physician.  Schedule screening colonoscopy at age 52.  2. Tubal ligation status  3. Menorrhagia with regular cycle Progressive menorrhagia with regular cycles.  Rule out anemia with a CBC today.  Patient will follow-up with a pelvic ultrasound to rule out pathology, including endometrial polyps, uterine fibroids, endometrial hyperplasia and endometrial cancer. - CBC - US Transvaginal Non-OB; Future  Savannha, fue un placer encontrarle hoy!  Voy a informarle de sus [employer].

## 2018-06-26 NOTE — Addendum Note (Signed)
Addended by: Thurnell Garbe A on: 06/26/2018 10:47 AM   Modules accepted: Orders

## 2018-06-27 LAB — PAP IG W/ RFLX HPV ASCU

## 2018-07-19 ENCOUNTER — Other Ambulatory Visit: Payer: Self-pay | Admitting: Obstetrics & Gynecology

## 2018-07-19 ENCOUNTER — Ambulatory Visit (INDEPENDENT_AMBULATORY_CARE_PROVIDER_SITE_OTHER): Payer: Managed Care, Other (non HMO)

## 2018-07-19 ENCOUNTER — Ambulatory Visit (INDEPENDENT_AMBULATORY_CARE_PROVIDER_SITE_OTHER): Payer: Managed Care, Other (non HMO) | Admitting: Obstetrics & Gynecology

## 2018-07-19 DIAGNOSIS — N920 Excessive and frequent menstruation with regular cycle: Secondary | ICD-10-CM

## 2018-07-19 DIAGNOSIS — D252 Subserosal leiomyoma of uterus: Secondary | ICD-10-CM

## 2018-07-19 DIAGNOSIS — D251 Intramural leiomyoma of uterus: Secondary | ICD-10-CM

## 2018-07-19 DIAGNOSIS — D219 Benign neoplasm of connective and other soft tissue, unspecified: Secondary | ICD-10-CM | POA: Diagnosis not present

## 2018-07-19 NOTE — Progress Notes (Signed)
    Tracey Wood 1966-05-17 [id number]        51 y.o.  G0P0000   RP: Menorrhagia for Pelvic US  HPI: LMP 07/12/2018 was a normal light flow x 5 days.Prior to this last menstrual period, patient has had a heavy periods every month for the last year.  No pelvic pain.  No breakthrough bleeding.    OB History  Gravida Para Term Preterm AB Living  0 0 0 0 0 0  SAB TAB Ectopic Multiple Live Births  0 0 0 0      Past medical history,surgical history, problem list, medications, allergies, family history and social history were all reviewed and documented in the EPIC chart.   Directed ROS with pertinent positives and negatives documented in the history of present illness/assessment and plan.  Exam:  There were no vitals filed for this visit. General appearance:  Normal  Pelvic US today: T/V images.  Anteverted uterus measuring 8.53 x 5.34 x 4.03 cm.  Uterine fibroids: Posterior subserous measuring 1.4 x 1.6 cm, intramural measuring 1.8 x 1.3 cm.  Normal tri-layered endometrium measuring 2.6 mm.  Right ovary with an echo-free follicle measuring 1.8 x 1.7 cm.  Left ovary normal.  No free fluid in the posterior cul-de-sac.   Assessment/Plan:  52 y.o. G0P0000   1. Menorrhagia with regular cycle Last menstrual period normal.  Pelvic ultrasound findings reviewed with patient.  Endometrial line normal and thin at 2.6 mm.  Patient reassured and decision made to observe.  2. Fibroids 2 small fibroids, intramural at 1.8 cm and subserosal at 1.6 cm.  At this point in her life, fibroids likely not to need surgical excision.  Decision to observe.   Counseling on above issues and coordination of care more than 50% for 15 minutes.    [name] MD, 4:30 PM 07/19/2018

## 2018-07-23 ENCOUNTER — Encounter: Payer: Self-pay | Admitting: Obstetrics & Gynecology

## 2018-07-23 NOTE — Patient Instructions (Addendum)
1. Menorrhagia with regular cycle Last menstrual period normal.  Pelvic ultrasound findings reviewed with patient.  Endometrial line normal and thin at 2.6 mm.  Patient reassured and decision made to observe.  2. Fibroids 2 small fibroids, intramural at 1.8 cm and subserosal at 1.6 cm.  At this point in her life, fibroids likely not to need surgical excision.  Decision to observe.  Tracey Wood, fue un placer verle hoy!

## 2019-06-26 ENCOUNTER — Encounter: Payer: Managed Care, Other (non HMO) | Admitting: Obstetrics & Gynecology

## 2019-06-28 ENCOUNTER — Other Ambulatory Visit: Payer: Self-pay

## 2019-07-02 ENCOUNTER — Other Ambulatory Visit: Payer: Self-pay

## 2019-07-02 ENCOUNTER — Encounter: Payer: Self-pay | Admitting: Obstetrics & Gynecology

## 2019-07-02 ENCOUNTER — Ambulatory Visit (INDEPENDENT_AMBULATORY_CARE_PROVIDER_SITE_OTHER): Payer: Managed Care, Other (non HMO) | Admitting: Obstetrics & Gynecology

## 2019-07-02 VITALS — BP 126/82 | Ht 63.0 in | Wt 151.0 lb

## 2019-07-02 DIAGNOSIS — Z9851 Tubal ligation status: Secondary | ICD-10-CM | POA: Diagnosis not present

## 2019-07-02 DIAGNOSIS — M25552 Pain in left hip: Secondary | ICD-10-CM | POA: Diagnosis not present

## 2019-07-02 DIAGNOSIS — Z01419 Encounter for gynecological examination (general) (routine) without abnormal findings: Secondary | ICD-10-CM | POA: Diagnosis not present

## 2019-07-02 DIAGNOSIS — D229 Melanocytic nevi, unspecified: Secondary | ICD-10-CM

## 2019-07-02 NOTE — Patient Instructions (Signed)
  1. Encounter for routine gynecological examination with Papanicolaou smear of cervix Normal gynecologic exam.  Pap reflex done.  Breast exam normal.  Screeening Mammo negative 11/2018.  Patient scheduling a screening Colono with Gastro.  Health labs with Fam MD.  BMI 26.75.  Continue with healthy nutrition.  2. Tubal ligation status  3. Left hip pain Recommend urgent f/u with Rheumato Dr Marella Chimes to organize an MRI of the Left hip.  4. Skin mole Recommend a full skin exam by Dermato.  We will organize referral to Dermato.  Other orders - Abatacept (ORENCIA [place]); Inject into the skin. - Ascorbic Acid (VITAMIN C) 100 MG tablet; Take 100 mg by mouth daily.  Judee Clara, fue un placer verle hoy!  Voy a informarle de sus [employer].

## 2019-07-02 NOTE — Progress Notes (Signed)
Tracey Wood 10/14/1966 [id number]   History:    52 y.o. G0 Married.  Adopted daughter pregnant with 3rd baby.  RP:  Established patient presenting for annual gyn exam   HPI: Menses every 1-2 months, normal flow.  No BTB.  No pelvic pain.  No pain with IC.  Urine/BMs normal.  Breasts normal.  BMI 26.75.  Not exercising regularly currently d/t severe left hip pains.  Followed by Rheumato Dr Tracey Wood for Arthritis.  Has skin moles, no Dermato.  Health labs with Fam MD.  Past medical history,surgical history, family history and social history were all reviewed and documented in the EPIC chart.  Gynecologic History Patient's last menstrual period was 06/05/2019. Contraception: tubal ligation Last Pap: 06/2018. Results were: Negative Last mammogram: 11/2018. Results were: Negative Bone Density: Never Colonoscopy: Had an appointment with Gastro, but canceled d/t Covid, will reschedule  Obstetric History OB History  Gravida Para Term Preterm AB Living  0 0 0 0 0 0  SAB TAB Ectopic Multiple Live Births  0 0 0 0       ROS: A ROS was performed and pertinent positives and negatives are included in the history.  GENERAL: No fevers or chills. HEENT: No change in vision, no earache, sore throat or sinus congestion. NECK: No pain or stiffness. CARDIOVASCULAR: No chest pain or pressure. No palpitations. PULMONARY: No shortness of breath, cough or wheeze. GASTROINTESTINAL: No abdominal pain, nausea, vomiting or diarrhea, melena or bright red blood per rectum. GENITOURINARY: No urinary frequency, urgency, hesitancy or dysuria. MUSCULOSKELETAL: No joint or muscle pain, no back pain, no recent trauma. DERMATOLOGIC: No rash, no itching, no lesions. ENDOCRINE: No polyuria, polydipsia, no heat or cold intolerance. No recent change in weight. HEMATOLOGICAL: No anemia or easy bruising or bleeding. NEUROLOGIC: No headache, seizures, numbness, tingling or weakness. PSYCHIATRIC: No depression, no loss of  interest in normal activity or change in sleep pattern.     Exam:   BP 126/82    Ht 5\' 3"  (1.6 m)    Wt 151 lb (68.5 kg)    LMP 06/05/2019    BMI 26.75 kg/m   Body mass index is 26.75 kg/m.  General appearance : Well developed well nourished female. No acute distress HEENT: Eyes: no retinal hemorrhage or exudates,  Neck supple, trachea midline, no carotid bruits, no thyroidmegaly Lungs: Clear to auscultation, no rhonchi or wheezes, or rib retractions  Heart: Regular rate and rhythm, no murmurs or gallops Breast:Examined in sitting and supine position were symmetrical in appearance, no palpable masses or tenderness,  no skin retraction, no nipple inversion, no nipple discharge, no skin discoloration, no axillary or supraclavicular lymphadenopathy Abdomen: no palpable masses or tenderness, no rebound or guarding Extremities: no edema or skin discoloration or tenderness  Pelvic: Vulva: Normal             Vagina: No gross lesions or discharge  Cervix: No gross lesions or discharge.  Pap reflex done  Uterus  AV, normal size, shape and consistency, non-tender and mobile  Adnexa  Without masses or tenderness  Anus: Normal   Assessment/Plan:  53 y.o. female for annual exam   1. Encounter for routine gynecological examination with Papanicolaou smear of cervix Normal gynecologic exam.  Pap reflex done.  Breast exam normal.  Screeening Mammo negative 11/2018.  Patient scheduling a screening Colono with Gastro.  Health labs with Fam MD.  BMI 26.75.  Continue with healthy nutrition.  2. Tubal ligation status  3. Left  hip pain Recommend urgent f/u with Rheumato Dr Tracey Wood to organize an MRI of the Left hip.  4. Skin mole Recommend a full skin exam by Dermato.  We will organize referral to Dermato.  Other orders - Abatacept (ORENCIA [place]); Inject into the skin. - Ascorbic Acid (VITAMIN C) 100 MG tablet; Take 100 mg by mouth daily.  Tracey Bruins MD, 3:47 PM 07/02/2019

## 2019-07-02 NOTE — Addendum Note (Signed)
Addended by: Thurnell Garbe A on: 07/02/2019 04:28 PM   Modules accepted: Orders

## 2019-07-03 LAB — PAP IG W/ RFLX HPV ASCU

## 2019-07-05 ENCOUNTER — Telehealth: Payer: Self-pay | Admitting: *Deleted

## 2019-07-05 ENCOUNTER — Encounter: Payer: Self-pay | Admitting: *Deleted

## 2019-07-05 NOTE — Telephone Encounter (Signed)
Patient scheduled on 08/28/19 @ 1:15pm at [facility], office notes faxed.  I also receive a referral to schedule Rheumato appointment due to hip pain, I explained to patient Rheumato office requires office notes, imaging to support the referral to rheumato, best to follow up with PCP. Patient verbalized she understood.

## 2019-07-05 NOTE — Telephone Encounter (Signed)
-----   Message from [name], MD sent at 07/02/2019  4:11 PM EDT ----- Regarding: Refer to Dermato A few skin moles.  Needs a full skin exam.

## 2019-09-17 ENCOUNTER — Other Ambulatory Visit: Payer: Self-pay

## 2019-09-17 DIAGNOSIS — Z20822 Contact with and (suspected) exposure to covid-19: Secondary | ICD-10-CM

## 2019-09-19 LAB — NOVEL CORONAVIRUS, NAA: SARS-CoV-2, NAA: NOT DETECTED

## 2019-10-08 ENCOUNTER — Emergency Department (HOSPITAL_COMMUNITY): Payer: Managed Care, Other (non HMO)

## 2019-10-08 ENCOUNTER — Encounter (HOSPITAL_COMMUNITY): Payer: Self-pay | Admitting: Emergency Medicine

## 2019-10-08 ENCOUNTER — Emergency Department (HOSPITAL_COMMUNITY)
Admission: EM | Admit: 2019-10-08 | Discharge: 2019-10-08 | Disposition: A | Discharge status: Home / Self Care | Payer: Managed Care, Other (non HMO) | Attending: Emergency Medicine | Admitting: Emergency Medicine

## 2019-10-08 ENCOUNTER — Other Ambulatory Visit: Payer: Self-pay

## 2019-10-08 DIAGNOSIS — N390 Urinary tract infection, site not specified: Secondary | ICD-10-CM | POA: Insufficient documentation

## 2019-10-08 DIAGNOSIS — K760 Fatty (change of) liver, not elsewhere classified: Secondary | ICD-10-CM | POA: Diagnosis not present

## 2019-10-08 DIAGNOSIS — R1084 Generalized abdominal pain: Secondary | ICD-10-CM

## 2019-10-08 DIAGNOSIS — N83201 Unspecified ovarian cyst, right side: Secondary | ICD-10-CM | POA: Insufficient documentation

## 2019-10-08 DIAGNOSIS — Z20822 Contact with and (suspected) exposure to covid-19: Secondary | ICD-10-CM

## 2019-10-08 LAB — LACTIC ACID, PLASMA: Lactic Acid, Venous: 1.5 mmol/L (ref 0.5–1.9)

## 2019-10-08 LAB — URINALYSIS, ROUTINE W REFLEX MICROSCOPIC
Bilirubin Urine: NEGATIVE
Glucose, UA: NEGATIVE mg/dL
Ketones, ur: 80 mg/dL — AB
Nitrite: POSITIVE — AB
Protein, ur: 100 mg/dL — AB
RBC / HPF: >50 RBC/hpf — ABNORMAL HIGH (ref 0–5)
Specific Gravity, Urine: 1.015 (ref 1.005–1.030)
pH: 6 (ref 5.0–8.0)

## 2019-10-08 LAB — CBC
HCT: 41.3 % (ref 36.0–46.0)
Hemoglobin: 13.6 g/dL (ref 12.0–15.0)
MCH: 29.4 pg (ref 26.0–34.0)
MCHC: 32.9 g/dL (ref 30.0–36.0)
MCV: 89.4 fL (ref 80.0–100.0)
Platelets: 305 10*3/uL (ref 150–400)
RBC: 4.62 MIL/uL (ref 3.87–5.11)
RDW: 13.4 % (ref 11.5–15.5)
WBC: 17.5 10*3/uL — ABNORMAL HIGH (ref 4.0–10.5)
nRBC: 0 % (ref 0.0–0.2)

## 2019-10-08 LAB — COMPREHENSIVE METABOLIC PANEL
ALT: 25 U/L (ref 0–44)
AST: 21 U/L (ref 15–41)
Albumin: 4 g/dL (ref 3.5–5.0)
Alkaline Phosphatase: 71 U/L (ref 38–126)
Anion gap: 10 (ref 5–15)
BUN: 9 mg/dL (ref 6–20)
CO2: 24 mmol/L (ref 22–32)
Calcium: 9.2 mg/dL (ref 8.9–10.3)
Chloride: 102 mmol/L (ref 98–111)
Creatinine, Ser: 0.62 mg/dL (ref 0.44–1.00)
GFR calc Af Amer: >60 mL/min (ref >60)
GFR calc non Af Amer: >60 mL/min (ref >60)
Glucose, Bld: 113 mg/dL — ABNORMAL HIGH (ref 70–99)
Potassium: 3.7 mmol/L (ref 3.5–5.1)
Sodium: 136 mmol/L (ref 135–145)
Total Bilirubin: 0.8 mg/dL (ref 0.3–1.2)
Total Protein: 7.7 g/dL (ref 6.5–8.1)

## 2019-10-08 LAB — LIPASE, BLOOD: Lipase: 23 U/L (ref 11–51)

## 2019-10-08 MED ORDER — FENTANYL CITRATE (PF) 100 MCG/2ML IJ SOLN
50.0000 ug | Freq: Once | INTRAMUSCULAR | Status: AC
  Administered 2019-10-08: 50 ug via INTRAVENOUS
  Filled 2019-10-08: qty 2

## 2019-10-08 MED ORDER — CEPHALEXIN 500 MG PO CAPS: 500.0000 mg | ORAL_CAPSULE | Freq: Two times a day (BID) | ORAL | 0 refills | Status: AC

## 2019-10-08 MED ORDER — SODIUM CHLORIDE 0.9 % IV BOLUS
1000.0000 mL | Freq: Once | INTRAVENOUS | Status: AC
  Administered 2019-10-08: 1000 mL via INTRAVENOUS

## 2019-10-08 MED ORDER — ONDANSETRON 4 MG PO TBDP: 4.0000 mg | ORAL_TABLET | Freq: Three times a day (TID) | ORAL | 0 refills | Status: DC | PRN

## 2019-10-08 MED ORDER — IOHEXOL 300 MG/ML  SOLN
100.0000 mL | Freq: Once | INTRAMUSCULAR | Status: AC | PRN
  Administered 2019-10-08: 100 mL via INTRAVENOUS

## 2019-10-08 MED ORDER — SODIUM CHLORIDE (PF) 0.9 % IJ SOLN
INTRAMUSCULAR | Status: AC
  Filled 2019-10-08: qty 50

## 2019-10-08 MED ORDER — SODIUM CHLORIDE 0.9% FLUSH: 3.0000 mL | Freq: Once | INTRAVENOUS | Status: DC

## 2019-10-08 MED ORDER — SODIUM CHLORIDE 0.9 % IV SOLN
1.0000 g | Freq: Once | INTRAVENOUS | Status: AC
  Administered 2019-10-08: 1 g via INTRAVENOUS
  Filled 2019-10-08: qty 10

## 2019-10-08 MED ORDER — ONDANSETRON HCL 4 MG/2ML IJ SOLN
4.0000 mg | Freq: Once | INTRAMUSCULAR | Status: AC
  Administered 2019-10-08: 4 mg via INTRAVENOUS
  Filled 2019-10-08: qty 2

## 2019-10-08 NOTE — ED Triage Notes (Signed)
Pt complaint of LLQ pain with vaginal spotting onset yesterday; "but I don't have periods."

## 2019-10-08 NOTE — ED Notes (Signed)
Pt verbalized discharge instructions and follow up care. Alert and ambulatory. No IV. Husband is driving pt home  

## 2019-10-08 NOTE — ED Provider Notes (Signed)
[facility] DEPT Provider Note   CSN: [id number] Arrival date & time: 10/08/19  1200     History   Chief Complaint Chief Complaint  Patient presents with  . Abdominal Pain    HPI Tracey Wood is a 53 y.o. female.     53 year old female presents with complaint of abdominal pain.  Patient reports onset of right flank pain yesterday now with diffuse abdominal pain which wraps around her back on both sides and is worse with movement and bending.  Patient denies changes in bowel or bladder habits.  Patient reports nausea without vomiting, also reports noting a small amount of blood in the commode today, unsure if from her vagina or urethra, denies blood in her stools.  No known sick contacts, denies fevers or respiratory symptoms.  No other complaints or concerns.     Past Medical History:  Diagnosis Date  . Renal disorder   . Thyroid disease     Patient Active Problem List   Diagnosis Date Noted  . Intramural leiomyoma of uterus 07/30/2016  . WEIGHT GAIN 08/20/2009  . TOBACCO ABUSE 03/14/2009    Past Surgical History:  Procedure Laterality Date  . TUBAL LIGATION       OB History    Gravida  0   Para  0   Term  0   Preterm  0   AB  0   Living  0     SAB  0   TAB  0   Ectopic  0   Multiple  0   Live Births               Home Medications    Prior to Admission medications   Medication Sig Start Date End Date Taking? Authorizing Provider  Abatacept (ORENCIA [place]) Inject into the skin.   Yes [provider]  Ascorbic Acid (VITAMIN C) 100 MG tablet Take 100 mg by mouth daily.   Yes [provider]  meloxicam (MOBIC) 15 MG tablet Take 15 mg by mouth daily. 08/27/19  Yes [provider]  Vitamin D, Ergocalciferol, (DRISDOL) 1.25 MG (50000 UT) CAPS capsule Take 50,000 Units by mouth every 7 (seven) days.   Yes [provider]  cephALEXin (KEFLEX) 500 MG capsule Take 1 capsule (500  mg total) by mouth 2 (two) times daily for 10 days. 10/08/19 10/18/19  Suella Broad A, PA-C  ondansetron (ZOFRAN-ODT) 4 MG disintegrating tablet Take 1 tablet (4 mg total) by mouth every 8 (eight) hours as needed for nausea or vomiting. 10/08/19   Tacy Learn, PA-C    Family History Family History  Problem Relation Age of Onset  . Breast cancer Maternal Grandmother 49    Social History Social History   Tobacco Use  . Smoking status: Never Smoker  . Smokeless tobacco: Never Used  Substance Use Topics  . Alcohol use: No    Alcohol/week: 0.0 standard drinks  . Drug use: No     Allergies   Morphine and related, Ciprofloxacin, Oxycodone, and Tape   Review of Systems Review of Systems  Constitutional: Negative for fever.  Respiratory: Negative for cough.   Cardiovascular: Negative for chest pain.  Gastrointestinal: Positive for abdominal pain and nausea. Negative for blood in stool, constipation, diarrhea and vomiting.  Genitourinary: Positive for flank pain. Negative for dysuria, frequency and urgency.  Musculoskeletal: Positive for back pain. Negative for gait problem.  Skin: Negative for rash and wound.  Allergic/Immunologic: Negative for immunocompromised state.  Neurological: Negative for dizziness and weakness.  All other systems reviewed and are negative.    Physical Exam Updated Vital Signs BP (!) 155/93   Pulse 95   Temp 99.5 F (37.5 C) (Oral)   Resp 16   LMP 06/05/2019 Comment: tubal ligation  SpO2 98%   Physical Exam Vitals signs and nursing note reviewed.  Constitutional:      General: She is not in acute distress.    Appearance: She is well-developed. She is not diaphoretic.  HENT:     Head: Normocephalic and atraumatic.  Cardiovascular:     Rate and Rhythm: Regular rhythm. Tachycardia present.     Heart sounds: Normal heart sounds.  Pulmonary:     Effort: Pulmonary effort is normal.     Breath sounds: Normal breath sounds.  Abdominal:      Palpations: Abdomen is soft.     Tenderness: There is generalized abdominal tenderness. There is right CVA tenderness. There is no left CVA tenderness.  Musculoskeletal:       Back:  Skin:    General: Skin is warm and dry.     Findings: No rash.  Neurological:     Mental Status: She is alert and oriented to person, place, and time.  Psychiatric:        Behavior: Behavior normal.      ED Treatments / Results  Labs (all labs ordered are listed, but only abnormal results are displayed) Labs Reviewed  COMPREHENSIVE METABOLIC PANEL - Abnormal; Notable for the following components:      Result Value   Glucose, Bld 113 (*)    All other components within normal limits  CBC - Abnormal; Notable for the following components:   WBC 17.5 (*)    All other components within normal limits  URINALYSIS, ROUTINE W REFLEX MICROSCOPIC - Abnormal; Notable for the following components:   APPearance CLOUDY (*)    Hgb urine dipstick LARGE (*)    Ketones, ur 80 (*)    Protein, ur 100 (*)    Nitrite POSITIVE (*)    Leukocytes,Ua LARGE (*)    RBC / HPF >50 (*)    Bacteria, UA FEW (*)    All other components within normal limits  URINE CULTURE  CULTURE, BLOOD (ROUTINE X 2)  CULTURE, BLOOD (ROUTINE X 2)  LIPASE, BLOOD  LACTIC ACID, PLASMA    EKG None  Radiology Ct Abdomen Pelvis W Contrast  Result Date: 10/08/2019 CLINICAL DATA:  Acute generalized abdominal pain. EXAM: CT ABDOMEN AND PELVIS WITH CONTRAST TECHNIQUE: Multidetector CT imaging of the abdomen and pelvis was performed using the standard protocol following bolus administration of intravenous contrast. CONTRAST:  113mL OMNIPAQUE IOHEXOL 300 MG/ML  SOLN COMPARISON:  July 12, 2015 FINDINGS: Lower chest: No acute abnormality. Hepatobiliary: Marked hepatic steatosis. Normal appearance of the gallbladder. Pancreas: Unremarkable. No pancreatic ductal dilatation or surrounding inflammatory changes. Spleen: Normal in size without focal  abnormality. Adrenals/Urinary Tract: Adrenal glands are unremarkable. Kidneys are normal, without renal calculi, focal lesion, or hydronephrosis. Bladder is unremarkable. Indistinctness and hyperenhancement of the bilateral ureters without evidence of obstructing calculus. Stomach/Bowel: Stomach is within normal limits. Appendix appears normal. No evidence of bowel wall thickening, distention, or inflammatory changes. Vascular/Lymphatic: No significant vascular findings are present. No enlarged abdominal or pelvic lymph nodes. Reproductive: Borderline distention of the endometrial canal to 1.6 cm. Probable subserosal fibroid off of the posterior uterine fundus. Nabothian cysts. 4.9 cm simple appearing cyst on the right ovary. Other: No abdominal  wall hernia or abnormality. No abdominopelvic ascites. Musculoskeletal: Mild spondylosis of the lumbosacral spine. Limbus vertebrae at L3 IMPRESSION: 1. Marked hepatic steatosis. 2. Indistinctness and hyperenhancement of the bilateral ureters without evidence of obstructing calculus. This may represent infectious/inflammatory changes. Please correlate to urinalysis. 3. Borderline distention of the endometrial canal to 1.6 cm. Probable subserosal fibroid off of the posterior uterine fundus. 4. 4.9 cm simple appearing right ovarian cyst. Follow-up in 6-8 weeks to assure resolution may be considered. Electronically Signed   By: Fidela Salisbury M.D.   On: 10/08/2019 20:34    Procedures Procedures (including critical care time)  Medications Ordered in ED Medications  sodium chloride flush (NS) 0.9 % injection 3 mL (has no administration in time range)  sodium chloride (PF) 0.9 % injection (has no administration in time range)  sodium chloride 0.9 % bolus 1,000 mL (0 mLs Intravenous Stopped 10/08/19 2059)  cefTRIAXone (ROCEPHIN) 1 g in sodium chloride 0.9 % 100 mL IVPB (0 g Intravenous Stopped 10/08/19 2028)  ondansetron (ZOFRAN) injection 4 mg (4 mg Intravenous  Given 10/08/19 1946)  fentaNYL (SUBLIMAZE) injection 50 mcg (50 mcg Intravenous Given 10/08/19 1947)  iohexol (OMNIPAQUE) 300 MG/ML solution 100 mL (100 mLs Intravenous Contrast Given 10/08/19 2004)     Initial Impression / Assessment and Plan / ED Course  I have reviewed the triage vital signs and the nursing notes.  Pertinent labs & imaging results that were available during my care of the patient were reviewed by me and considered in my medical decision making (see chart for details).  Clinical Course as of Oct 07 2058  Mon Oct 08, 3419  2942 53 year old female presents with complaint of right flank pain onset yesterday of generalized abdominal wound to her left and right low back.  Patient denies changes in bowel or bladder habits and reports questionable blood in her urine versus vaginal bleeding. On exam patient appears to be uncomfortable, she has right CVA tenderness, right and left low back tenderness with generalized abdominal pain to palpation. Patient was given Zofran and fentanyl with IV fluids and Rocephin after review of lab work, states she is feeling better at this time. Review of lab work shows likely urinary tract infection, urine is positive for leukocytes and nitrates with large hemoglobin, ketones, protein.  Lactic acid is normal, CMP within normal limits, CBC with white blood cell count of 17.5. CT abdomen pelvis with contrast shows 1) marked hepatic steatosis 2) indistinctness and hyperenhancement of bilateral ureters without evidence of obstructing calculus may represent infectious or inflammatory changes correlate with UA 3) borderline distention endometrial canal 1.6 cm probable subserosal fibroid of the posterior uterine fundus 4) 4.9 cm simple appearing right ovarian cyst follow-up in 6 to 8 weeks to assure resolution. Discussed CT results with patient and her husband.  Patient be treated with antibiotics, given Zofran to take as needed and recommend recheck with PCP  later this week to review her CT findings and recheck.  Patient given return to ER precautions.  Patient and husband verbalized understanding of discharge instructions and plan.   [LM]    Clinical Course User Index [LM] Tacy Learn, PA-C      Final Clinical Impressions(s) / ED Diagnoses   Final diagnoses:  Generalized abdominal pain  Lower urinary tract infectious disease  Cyst of right ovary  Hepatic steatosis    ED Discharge Orders         Ordered    cephALEXin (KEFLEX) 500 MG capsule  2  times daily     10/08/19 2056    ondansetron (ZOFRAN-ODT) 4 MG disintegrating tablet  Every 8 hours PRN     10/08/19 2056           Roque Lias 10/08/19 2100    Dorie Rank, MD 10/09/19 0100

## 2019-10-08 NOTE — Discharge Instructions (Signed)
Take antibiotics as prescribed and complete the full course.  Take Zofran as needed as prescribed for nausea vomiting. Follow-up with your primary care provider later this week.  Return to ER for fever, worsening pain, uncontrollable vomiting or any other concerns.

## 2019-10-09 LAB — NOVEL CORONAVIRUS, NAA: SARS-CoV-2, NAA: NOT DETECTED

## 2019-10-10 LAB — URINE CULTURE: Culture: >=100000 — AB

## 2019-10-11 ENCOUNTER — Telehealth: Payer: Self-pay

## 2019-10-11 NOTE — Telephone Encounter (Signed)
Post ED Visit - Positive Culture Follow-up  Culture report reviewed by antimicrobial stewardship pharmacist: [place] [facility] []  [address], Pharm.D. []  Heide Guile, Pharm.D., BCPS AQ-ID []  Parks Neptune, Pharm.D., BCPS []  Alycia Rossetti, Pharm.D., BCPS []  [place], Pharm.D., BCPS, AAHIVP []  Legrand Como, Pharm.D., BCPS, AAHIVP []  Salome Arnt, PharmD, BCPS []  Johnnette Gourd, PharmD, BCPS []  Hughes Better, PharmD, BCPS []  Leeroy Cha, PharmD []  Laqueta Linden, PharmD, BCPS []  Albertina Parr, PharmD  [place] Team []  Leodis Sias, PharmD []  Lindell Spar, PharmD []  Royetta Asal, PharmD []  Graylin Shiver, Rph []  Rema Fendt) Glennon Mac, PharmD []  Arlyn Dunning, PharmD []  Netta Cedars, PharmD []  Dia Sitter, PharmD []  Leone Haven, PharmD []  Gretta Arab, PharmD []  Theodis Shove, PharmD []  Peggyann Juba, PharmD [x]  Reuel Boom, PharmD   Positive urine culture Treated with Cephalexin, organism sensitive to the same and no further patient follow-up is required at this time.  Genia Del 10/11/2019, 10:02 AM

## 2019-10-13 LAB — CULTURE, BLOOD (ROUTINE X 2)
Culture: NO GROWTH
Culture: NO GROWTH

## 2019-10-25 DIAGNOSIS — N83201 Unspecified ovarian cyst, right side: Secondary | ICD-10-CM | POA: Insufficient documentation

## 2019-10-30 ENCOUNTER — Ambulatory Visit: Payer: Managed Care, Other (non HMO) | Admitting: Obstetrics & Gynecology

## 2019-10-30 ENCOUNTER — Encounter: Payer: Self-pay | Admitting: Obstetrics & Gynecology

## 2019-10-30 ENCOUNTER — Other Ambulatory Visit: Payer: Self-pay

## 2019-10-30 VITALS — BP 144/90

## 2019-10-30 DIAGNOSIS — N914 Secondary oligomenorrhea: Secondary | ICD-10-CM

## 2019-10-30 DIAGNOSIS — Z113 Encounter for screening for infections with a predominantly sexual mode of transmission: Secondary | ICD-10-CM | POA: Diagnosis not present

## 2019-10-30 DIAGNOSIS — N83201 Unspecified ovarian cyst, right side: Secondary | ICD-10-CM

## 2019-10-30 DIAGNOSIS — N898 Other specified noninflammatory disorders of vagina: Secondary | ICD-10-CM

## 2019-10-30 DIAGNOSIS — R9389 Abnormal findings on diagnostic imaging of other specified body structures: Secondary | ICD-10-CM

## 2019-10-30 DIAGNOSIS — R102 Pelvic and perineal pain: Secondary | ICD-10-CM

## 2019-10-30 LAB — WET PREP FOR TRICH, YEAST, CLUE

## 2019-10-30 NOTE — Progress Notes (Signed)
    Tracey Wood 01-31-1966 [id number]        53 y.o.  G0 Married.  S/P TL.  Adopted daughter.  RP: Rt Ovarian Cyst with thickened uterine cavity on CT scan 10/08/2019  HPI: Rt Ovarian Cyst 4.9 cm and thickened uterine cavity, Fibroids on CT scan 10/08/2019.  Oligomenorrhea, currently with minimal spotting and watery discharge.  Mild intermittent pelvic discomfort, improved x CT scan.  No UTI Sx.  No fever.   OB History  Gravida Para Term Preterm AB Living  0 0 0 0 0 0  SAB TAB Ectopic Multiple Live Births  0 0 0 0      Past medical history,surgical history, problem list, medications, allergies, family history and social history were all reviewed and documented in the EPIC chart.   Directed ROS with pertinent positives and negatives documented in the history of present illness/assessment and plan.  Exam:  Vitals:   10/30/19 0944  BP: (!) 144/90   General appearance:  Normal  CVAT negative bilaterally  Abdomen normal  Gyn exam: Vulva normal.  Speculum:  Cervix/Vagina normal.  Gono-Chlam done.  Bimanual exam:  AV uterus, normal volume, NT, mobile.  No adnexal mass felt, NT bilaterally.  U/A: Yellow, clear, nitrites negative, protein negative, white blood cells 0-5, red blood cells 3-10, bacteria negative.  Urine culture pending. Wet prep negative   Assessment/Plan:  53 y.o. G0P0000   1. Pelvic pain in female Pelvic discomfort intermittently.  Will r/o STI.  Gono-Chlam/CBC today.  U/A abnormal, pending U. Culture.  Decision to start on MacroBID.   Rt Ovarian Cyst on CT scan, will f/u with a pelvic US to investigate further.   - C. trachomatis/N. gonorrhoeae RNA - CBC - US Transvaginal Non-OB; Future - Urinalysis,Complete w/RFL Culture  2. Right ovarian cyst Right ovarian cyst 4.9 cm on CT Scan 10/08/2019.  Will further investigate with a Pelvic US at f/u.  - US Transvaginal Non-OB; Future  3. Vaginal discharge Wet prep negative.  Gono-Chlam pending. - C.  trachomatis/N. gonorrhoeae RNA - CBC - WET PREP FOR TRICH, YEAST, CLUE  4. Screen for STD (sexually transmitted disease) - C. trachomatis/N. gonorrhoeae RNA  5. Secondary oligomenorrhea R/O Menopause with an [place] today. - FSH  6. Thickened endometrium Will further investigate with a Pelvic US at f/u, EBx as needed.  Other orders - nitrofurantoin, macrocrystal-monohydrate, (MACROBID) 100 MG capsule; Take 100 mg by mouth 2 (two) times daily.  Counseling on above issues and coordination of care >50% x 25 minutes.  [name] MD, 9:57 AM 10/30/2019

## 2019-10-31 LAB — CBC
HCT: 36.8 % (ref 35.0–45.0)
Hemoglobin: 12.2 g/dL (ref 11.7–15.5)
MCH: 28.9 pg (ref 27.0–33.0)
MCHC: 33.2 g/dL (ref 32.0–36.0)
MCV: 87.2 fL (ref 80.0–100.0)
MPV: 10 fL (ref 7.5–12.5)
Platelets: 294 10*3/uL (ref 140–400)
RBC: 4.22 10*6/uL (ref 3.80–5.10)
RDW: 12.9 % (ref 11.0–15.0)
WBC: 7.8 10*3/uL (ref 3.8–10.8)

## 2019-10-31 LAB — FOLLICLE STIMULATING HORMONE: FSH: 5.9 m[IU]/mL

## 2019-10-31 LAB — C. TRACHOMATIS/N. GONORRHOEAE RNA
C. trachomatis RNA, TMA: NOT DETECTED
N. gonorrhoeae RNA, TMA: NOT DETECTED

## 2019-11-01 ENCOUNTER — Ambulatory Visit (INDEPENDENT_AMBULATORY_CARE_PROVIDER_SITE_OTHER): Payer: Managed Care, Other (non HMO)

## 2019-11-01 ENCOUNTER — Ambulatory Visit: Payer: Managed Care, Other (non HMO) | Admitting: Obstetrics & Gynecology

## 2019-11-01 ENCOUNTER — Other Ambulatory Visit: Payer: Self-pay

## 2019-11-01 DIAGNOSIS — R9389 Abnormal findings on diagnostic imaging of other specified body structures: Secondary | ICD-10-CM

## 2019-11-01 DIAGNOSIS — N83201 Unspecified ovarian cyst, right side: Secondary | ICD-10-CM

## 2019-11-01 DIAGNOSIS — R102 Pelvic and perineal pain: Secondary | ICD-10-CM | POA: Diagnosis not present

## 2019-11-01 DIAGNOSIS — N6320 Unspecified lump in the left breast, unspecified quadrant: Secondary | ICD-10-CM

## 2019-11-01 LAB — URINALYSIS, COMPLETE W/RFL CULTURE
Bacteria, UA: NONE SEEN /HPF
Bilirubin Urine: NEGATIVE
Glucose, UA: NEGATIVE
Hyaline Cast: NONE SEEN /LPF
Ketones, ur: NEGATIVE
Leukocyte Esterase: NEGATIVE
Nitrites, Initial: NEGATIVE
Protein, ur: NEGATIVE
Specific Gravity, Urine: 1.015 (ref 1.001–1.03)
pH: 6 (ref 5.0–8.0)

## 2019-11-01 LAB — CULTURE INDICATED

## 2019-11-01 LAB — URINE CULTURE
MICRO NUMBER:: 1059147
SPECIMEN QUALITY:: ADEQUATE

## 2019-11-01 MED ORDER — MEDROXYPROGESTERONE ACETATE 5 MG PO TABS: 5.0000 mg | ORAL_TABLET | Freq: Every day | ORAL | 4 refills | Status: DC

## 2019-11-01 NOTE — Progress Notes (Signed)
Tracey Wood 1966-06-05 [id number]        53 y.o.  G0 Married.  1 adopted daughter.  RP: Rt Ovarian Cyst/Pelvic pain/Thickened Endometrium for Pelvic US  HPI: Had a CT scan 10/08/2019 showing a 4.9 cm Rt Ovarian Cyst and the uterine cavity/endoetrium appeared thick.  Patient felt a new Left upper external breast lump this morning, no breast pain, no change in skin, no nipple d/c.  Last screening Mammo 11/2018 was negative.  No Fam H/O Breast Ca.   OB History  Gravida Para Term Preterm AB Living  0 0 0 0 0 0  SAB TAB Ectopic Multiple Live Births  0 0 0 0      Past medical history,surgical history, problem list, medications, allergies, family history and social history were all reviewed and documented in the EPIC chart.   Directed ROS with pertinent positives and negatives documented in the history of present illness/assessment and plan.  Exam:  There were no vitals filed for this visit. General appearance:  Normal  Breast exam:  Rt breast and axilla normal.  Lt breast:  Mass 3 x 2.5 cm at 2 O'Clock, NT, mobile, irregular.  Skin normal.  Nipple normal, no d/c.  Lt axilla normal.  Pelvic US today: T/V images.  Anteverted uterus normal in size and shape measuring 9.05 x 5.10 x 4.48 cm.  A single subserosal fibroid is present posteriorly measuring 1.24 x 1.14 cm.  The endometrial lining shows generalized thickening with cystic changes.  The endometrial thickness is measured at 11.66 mm.  Central echogenicity is symmetrical with no intracavitary mass suspected.  The left ovary presents 2 simple follicles.  The right ovary shows a 3.9 x 2.7 cm simple thin-walled avascular cyst.  No free fluid in the posterior cul-de-sac.   Endometrial Biopsy:  Verbal consent obtained.  Vulva normal.  Speculum:  Cervix/Vagina normal.  No bleeding, normal secretions.  Betadine prep, Hurricane spray.  Easy insertion of Endometrial Bx canula.  EBx done with suction on all IU surfaces.  Moderately  abundant specimen obtained.  Good hemostasis.  All instruments removed.  Specimen sent to pathology.  No Cx.  Well tolerated by patient.   Assessment/Plan:  53 y.o. G0  1. Pelvic pain in female Patient had a CT scan October 08, 2019 showing a right ovarian cyst at 4.9 cm.  No current pelvic pain.  On pelvic ultrasound today the right ovarian cyst is smaller at 3.9 cm, it is a simple thin-walled avascular cyst.  Patient was reassured about the benign appearance of the cyst.  2. Thickened endometrium Thickened and cystic endometrium on pelvic ultrasound today.  The thickness of the endometrial lining was at 11.66.  No intracavitary mass suspected.  Therefore, an endometrial Bx was done.  The specimen was moderately abundant.  It was sent to pathology.  Provera 5 mg 1 tablet per mouth daily for 10 days to be taken cyclically every 3 months.  Patient will not start the Provera until the left breast mass has been fully investigated.  3. Right ovarian cyst As above, right ovarian cyst smaller on pelvic ultrasound today measured at 3.9 cm and simple, thin-walled and avascular.  Patient reassured about the benign appearance of the right ovarian cyst.  No indication to further investigate.  4. Left breast mass Patient felt a Lt breast mass this am.  3 x 2.5 cm at 2 O'Clock on left breast.  No pain or tenderness, no change in skin and no nipple discharge.  Last screening mammogram was negative in December 2019 and patient has no family history of breast cancer.  Will schedule a left diagnostic mammogram and ultrasound now.  Other orders - medroxyPROGESTERone (PROVERA) 5 MG tablet; Take 1 tablet (5 mg total) by mouth daily for 10 days. Cyclic Provera 1 tab PO daily x 10 every 3 months.  Counseling on above issues and coordination of care more than 50% for 25 minutes.  [name] MD, 4:53 PM 11/01/2019

## 2019-11-03 ENCOUNTER — Encounter: Payer: Self-pay | Admitting: Obstetrics & Gynecology

## 2019-11-03 NOTE — Patient Instructions (Signed)
1. Pelvic pain in female Patient had a CT scan October 08, 2019 showing a right ovarian cyst at 4.9 cm.  No current pelvic pain.  On pelvic ultrasound today the right ovarian cyst is smaller at 3.9 cm, it is a simple thin-walled avascular cyst.  Patient was reassured about the benign appearance of the cyst.  2. Thickened endometrium Thickened and cystic endometrium on pelvic ultrasound today.  The thickness of the endometrial lining was at 11.66.  No intracavitary mass suspected.  Therefore, an endometrial Bx was done.  The specimen was moderately abundant.  It was sent to pathology.  Provera 5 mg 1 tablet per mouth daily for 10 days to be taken cyclically every 3 months.  Patient will not start the Provera until the left breast mass has been fully investigated.  3. Right ovarian cyst As above, right ovarian cyst smaller on pelvic ultrasound today measured at 3.9 cm and simple, thin-walled and avascular.  Patient reassured about the benign appearance of the right ovarian cyst.  No indication to further investigate.  4. Left breast mass Patient felt a Lt breast mass this am.  3 x 2.5 cm at 2 O'Clock on left breast.  No pain or tenderness, no change in skin and no nipple discharge.  Last screening mammogram was negative in December 2019 and patient has no family history of breast cancer.  Will schedule a left diagnostic mammogram and ultrasound now.  Other orders - medroxyPROGESTERone (PROVERA) 5 MG tablet; Take 1 tablet (5 mg total) by mouth daily for 10 days. Cyclic Provera 1 tab PO daily x 10 every 3 months.  Tracey Wood, it was a pleasure seeing you today!  I will inform you of your results as soon as they are available.

## 2019-11-04 ENCOUNTER — Encounter: Payer: Self-pay | Admitting: Obstetrics & Gynecology

## 2019-11-04 NOTE — Patient Instructions (Signed)
1. Pelvic pain in female Pelvic discomfort intermittently.  Will r/o STI.  Gono-Chlam/CBC today.  U/A abnormal, pending U. Culture.  Decision to start on MacroBID.   Rt Ovarian Cyst on CT scan, will f/u with a pelvic US to investigate further.   - C. trachomatis/N. gonorrhoeae RNA - CBC - US Transvaginal Non-OB; Future - Urinalysis,Complete w/RFL Culture  2. Right ovarian cyst Right ovarian cyst 4.9 cm on CT Scan 10/08/2019.  Will further investigate with a Pelvic US at f/u.  - US Transvaginal Non-OB; Future  3. Vaginal discharge Wet prep negative.  Gono-Chlam pending. - C. trachomatis/N. gonorrhoeae RNA - CBC - WET PREP FOR TRICH, YEAST, CLUE  4. Screen for STD (sexually transmitted disease) - C. trachomatis/N. gonorrhoeae RNA  5. Secondary oligomenorrhea R/O Menopause with an [place] today. - FSH  6. Thickened endometrium Will further investigate with a Pelvic US at f/u, EBx as needed.  Other orders - nitrofurantoin, macrocrystal-monohydrate, (MACROBID) 100 MG capsule; Take 100 mg by mouth 2 (two) times daily.  [place], it was a pleasure seeing you today!

## 2019-11-06 LAB — PATHOLOGY REPORT

## 2019-11-06 LAB — TISSUE SPECIMEN

## 2019-11-07 ENCOUNTER — Other Ambulatory Visit: Payer: Self-pay | Admitting: Obstetrics & Gynecology

## 2019-11-07 ENCOUNTER — Telehealth: Payer: Self-pay | Admitting: *Deleted

## 2019-11-07 DIAGNOSIS — N6320 Unspecified lump in the left breast, unspecified quadrant: Secondary | ICD-10-CM

## 2019-11-07 NOTE — Telephone Encounter (Signed)
Per Dr Dellis Filbert schedule Left Dx mammo and possible [place] if needed for left brst mass 2o clock 3x2.5 cm.  Apt at [facility] 11/23 820am arrive 8am.  Pt informed and agreed to go. Address given. Order faxed Sharrie Rothman CMA

## 2019-11-08 ENCOUNTER — Telehealth: Payer: Self-pay | Admitting: *Deleted

## 2019-11-08 NOTE — Telephone Encounter (Signed)
Per last note  what is the dose of Megace you would like to call into the pharmacy? See below:  Notes recorded by [name], MD on 11/07/2019 at 12:05 PM EST  Patho Endometrial Biopsy: Simple hyperplasia. Negative for atypia or malignancy. Please schedule a visit to discuss results. Will start on Megace after the Left Dx Mammo/US 11/19/2019.

## 2019-11-09 NOTE — Telephone Encounter (Signed)
Megace 40 mg PO BID.  Repeat Endometrial Biopsy in 4 months.

## 2019-11-19 ENCOUNTER — Other Ambulatory Visit: Payer: Self-pay

## 2019-11-19 ENCOUNTER — Ambulatory Visit
Admission: RE | Admit: 2019-11-19 | Discharge: 2019-11-19 | Disposition: A | Discharge status: Home / Self Care | Payer: Managed Care, Other (non HMO) | Source: Ambulatory Visit | Attending: Obstetrics & Gynecology | Admitting: Obstetrics & Gynecology

## 2019-11-19 DIAGNOSIS — N6320 Unspecified lump in the left breast, unspecified quadrant: Secondary | ICD-10-CM

## 2019-11-19 DIAGNOSIS — Z20822 Contact with and (suspected) exposure to covid-19: Secondary | ICD-10-CM

## 2019-11-20 DIAGNOSIS — N390 Urinary tract infection, site not specified: Secondary | ICD-10-CM | POA: Insufficient documentation

## 2019-11-21 ENCOUNTER — Telehealth (INDEPENDENT_AMBULATORY_CARE_PROVIDER_SITE_OTHER): Payer: Managed Care, Other (non HMO) | Admitting: Obstetrics & Gynecology

## 2019-11-21 ENCOUNTER — Other Ambulatory Visit: Payer: Self-pay

## 2019-11-21 ENCOUNTER — Encounter: Payer: Self-pay | Admitting: Obstetrics & Gynecology

## 2019-11-21 DIAGNOSIS — N8501 Benign endometrial hyperplasia: Secondary | ICD-10-CM | POA: Diagnosis not present

## 2019-11-21 LAB — NOVEL CORONAVIRUS, NAA: SARS-CoV-2, NAA: DETECTED — AB

## 2019-11-21 MED ORDER — MEGESTROL ACETATE 40 MG PO TABS: 40.0000 mg | ORAL_TABLET | Freq: Two times a day (BID) | ORAL | 4 refills | Status: DC

## 2019-11-21 NOTE — Patient Instructions (Signed)
1. Simple endometrial hyperplasia without atypia Endometrial biopsy done on November 02, 2019 showed simple endometrial hyperplasia without atypia.  Patient is still experiencing frequent irregular vaginal bleeding.  Patient reassured about the fact that the hyperplasia was simple and without atypia.  Counseling done on diagnosis and management.  Will start treatment with Megace 40 mg twice a day for the next 4 months.  Usage reviewed and patient reassured that Megace should improve her bleeding pattern.  The treatment should also make the simple endometrial hyperplasia regress.  We will repeat an endometrial biopsy in 4 months.  Patient voiced understanding and agreement with plan.  Orders:  Megace 40 mg BID #60, refill x 4

## 2019-11-21 NOTE — Telephone Encounter (Signed)
60 tablets X 4 refills per ML

## 2019-11-21 NOTE — Progress Notes (Signed)
    Tracey Wood 18-Sep-1966 [id number]        53 y.o.  G0 Married.  Adopted daughter.  Televisit with video.  Patient gave verbal consent for a video visit.  Patient identified.  Video between patient in here home and myself in my [place] office.  Counseling for 25 minutes.  RP:  Simple Endometrial hyperplasia without atypia on EBx 11/02/19  HPI: Left Dx Mammo negative 11/19/2019.  Patient is having frequent irregular light to moderate vaginal bleeding.  No pelvic pain.  No abnormal vaginal discharge.  Urine/BMs normal.  No fever.   OB History  Gravida Para Term Preterm AB Living  0 0 0 0 0 0  SAB TAB Ectopic Multiple Live Births  0 0 0 0      Past medical history,surgical history, problem list, medications, allergies, family history and social history were all reviewed and documented in the EPIC chart.   Directed ROS with pertinent positives and negatives documented in the history of present illness/assessment and plan.  Exam:  There were no vitals filed for this visit. General appearance:  Normal  Televisit/Video   Assessment/Plan:  53 y.o. G0P0000   1. Simple endometrial hyperplasia without atypia Endometrial biopsy done on November 02, 2019 showed simple endometrial hyperplasia without atypia.  Patient is still experiencing frequent irregular vaginal bleeding.  Patient reassured about the fact that the hyperplasia was simple and without atypia.  Counseling done on diagnosis and management.  Will start treatment with Megace 40 mg twice a day for the next 4 months.  Usage reviewed and patient reassured that Megace should improve her bleeding pattern.  The treatment should also make the simple endometrial hyperplasia regress.  We will repeat an endometrial biopsy in 4 months.  Patient voiced understanding and agreement with plan.  Orders:  Megace 40 mg BID #60, refill x 4   [name] MD, 11:06 AM 11/21/2019

## 2019-11-30 ENCOUNTER — Other Ambulatory Visit: Payer: Self-pay

## 2019-11-30 DIAGNOSIS — Z20822 Contact with and (suspected) exposure to covid-19: Secondary | ICD-10-CM

## 2019-12-04 ENCOUNTER — Encounter: Payer: Self-pay | Admitting: *Deleted

## 2019-12-04 LAB — NOVEL CORONAVIRUS, NAA: SARS-CoV-2, NAA: DETECTED — AB

## 2019-12-26 DIAGNOSIS — R3129 Other microscopic hematuria: Secondary | ICD-10-CM | POA: Insufficient documentation

## 2020-01-29 ENCOUNTER — Other Ambulatory Visit: Payer: Self-pay

## 2020-01-29 ENCOUNTER — Ambulatory Visit: Payer: Managed Care, Other (non HMO) | Admitting: Family Medicine

## 2020-01-29 ENCOUNTER — Ambulatory Visit: Payer: 59 | Admitting: Family Medicine

## 2020-01-29 ENCOUNTER — Encounter: Payer: Self-pay | Admitting: Family Medicine

## 2020-01-29 VITALS — BP 132/80 | HR 90 | Temp 95.7°F | Resp 12 | Ht 63.0 in | Wt 152.0 lb

## 2020-01-29 DIAGNOSIS — M545 Low back pain, unspecified: Secondary | ICD-10-CM

## 2020-01-29 DIAGNOSIS — Z1211 Encounter for screening for malignant neoplasm of colon: Secondary | ICD-10-CM

## 2020-01-29 DIAGNOSIS — E782 Mixed hyperlipidemia: Secondary | ICD-10-CM

## 2020-01-29 DIAGNOSIS — R739 Hyperglycemia, unspecified: Secondary | ICD-10-CM | POA: Diagnosis not present

## 2020-01-29 DIAGNOSIS — I10 Essential (primary) hypertension: Secondary | ICD-10-CM

## 2020-01-29 DIAGNOSIS — E559 Vitamin D deficiency, unspecified: Secondary | ICD-10-CM | POA: Diagnosis not present

## 2020-01-29 LAB — BASIC METABOLIC PANEL
BUN: 10 mg/dL (ref 6–23)
CO2: 26 mEq/L (ref 19–32)
Calcium: 9.4 mg/dL (ref 8.4–10.5)
Chloride: 101 mEq/L (ref 96–112)
Creatinine, Ser: 0.64 mg/dL (ref 0.40–1.20)
GFR: 97.69 mL/min (ref >60.00)
Glucose, Bld: 85 mg/dL (ref 70–99)
Potassium: 4 mEq/L (ref 3.5–5.1)
Sodium: 136 mEq/L (ref 135–145)

## 2020-01-29 LAB — LDL CHOLESTEROL, DIRECT: Direct LDL: 116 mg/dL

## 2020-01-29 LAB — LIPID PANEL
Cholesterol: 178 mg/dL (ref 0–200)
HDL: 47 mg/dL (ref >39.00)
NonHDL: 131.33
Total CHOL/HDL Ratio: 4
Triglycerides: 217 mg/dL — ABNORMAL HIGH (ref 0.0–149.0)
VLDL: 43.4 mg/dL — ABNORMAL HIGH (ref 0.0–40.0)

## 2020-01-29 LAB — HEMOGLOBIN A1C: Hgb A1c MFr Bld: 5.6 % (ref 4.6–6.5)

## 2020-01-29 LAB — TSH: TSH: 3.27 u[IU]/mL (ref 0.35–4.50)

## 2020-01-29 LAB — VITAMIN D 25 HYDROXY (VIT D DEFICIENCY, FRACTURES): VITD: 16.48 ng/mL — ABNORMAL LOW (ref 30.00–100.00)

## 2020-01-29 MED ORDER — LOSARTAN POTASSIUM 50 MG PO TABS: 50.0000 mg | ORAL_TABLET | Freq: Every day | ORAL | 2 refills | Status: DC

## 2020-01-29 NOTE — Progress Notes (Signed)
HPI:   Tracey Wood is a 54 y.o. female, who is here today to establish care.  Former PCP: Dr Karle Starch. Last preventive routine visit: 12/03/19.  Chronic medical problems: RA,HLD, recurrent UTI (has seen urologist)  She is on Abatacept and Mobic 15 mg daily follows with rheum q 6 months.  Concerns today:   -Elevated glucose 113-125. No Hx of DM. Denies abdominal pain, nausea,vomiting, polydipsia,polyuria, or polyphagia.   -HTN: Since 09/2019 her BP has been elevated, 140-150/90, she is on Losartan 25 mg daily. Denies severe/frequent headache, visual changes, chest pain, dyspnea, palpitation, claudication, focal weakness, or edema.  -HLD: She is on non pharmacologic treatment. She would like to have FLP repeated.   Value Ref Range  LDL Direct 153 (H) <130 mg/dL  Total Cholesterol 191 25 - 199 MG/DL  Triglycerides 126 10 - 150 MG/DL  HDL Cholesterol 46 35 - 135 MG/DL  Total Chol / HDL Cholesterol 4.2 <4.5   Non-HDL Cholesterol 145    -3 weeks ago she moved, some lifting. Started with lower back pain, no radiated. She is on Flexeril 10 mg daily and needed,which also helps with IP joint pain.  -Vit D deficiency: She is not on vit D supplementation.  -Hx of hypothyroidism in 2010, TSH normalized. She would like to have thyroid function check.  She is concerend about wt, has gained some. She is not exercising regularly. She is trying to do better with following a healthful diet.   Review of Systems  Constitutional: Negative for activity change, appetite change, fatigue and fever.  HENT: Negative for mouth sores, nosebleeds and sore throat.   Respiratory: Negative for cough and wheezing.   Gastrointestinal: Negative for abdominal pain, nausea and vomiting.       Negative for changes in bowel habits.  Genitourinary: Negative for decreased urine volume, dysuria and hematuria.  Musculoskeletal: Positive for arthralgias. Negative for gait problem.    Neurological: Negative for syncope and facial asymmetry.  Rest see pertinent positives and negatives per HPI.   Current Outpatient Medications on File Prior to Visit  Medication Sig Dispense Refill  . Abatacept (ORENCIA [place]) Inject into the skin.    . cyclobenzaprine (FLEXERIL) 10 MG tablet Take 10 mg by mouth at bedtime.    . megestrol (MEGACE) 40 MG tablet Take 1 tablet (40 mg total) by mouth 2 (two) times daily. 60 tablet 4  . meloxicam (MOBIC) 15 MG tablet Take 15 mg by mouth daily.    . Vitamin D, Ergocalciferol, (DRISDOL) 1.25 MG (50000 UT) CAPS capsule Take 50,000 Units by mouth every 7 (seven) days.     No current facility-administered medications on file prior to visit.     Past Medical History:  Diagnosis Date  . Renal disorder   . Thyroid disease    Allergies  Allergen Reactions  . Morphine And Related   . Ciprofloxacin Rash  . Oxycodone Palpitations  . Tape Dermatitis    Family History  Problem Relation Age of Onset  . Breast cancer Maternal Grandmother 56    Social History   Socioeconomic History  . Marital status: Married    Spouse name: Not on file  . Number of children: Not on file  . Years of education: Not on file  . Highest education level: Not on file  Occupational History  . Not on file  Tobacco Use  . Smoking status: Never Smoker  . Smokeless tobacco: Never Used  Substance and Sexual Activity  .  Alcohol use: No    Alcohol/week: 0.0 standard drinks  . Drug use: No  . Sexual activity: Yes    Partners: Male    Birth control/protection: Other-see comments    Comment: 1st intercourse- 16, partners- 2, married- 15 yrs   Other Topics Concern  . Not on file  Social History Narrative  . Not on file   Social Determinants of Health   Financial Resource Strain:   . Difficulty of Paying Living Expenses: Not on file  Food Insecurity:   . Worried About Charity fundraiser in the Last Year: Not on file  . Ran Out of Food in the Last Year: Not  on file  Transportation Needs:   . Lack of Transportation (Medical): Not on file  . Lack of Transportation (Non-Medical): Not on file  Physical Activity:   . Days of Exercise per Week: Not on file  . Minutes of Exercise per Session: Not on file  Stress:   . Feeling of Stress : Not on file  Social Connections:   . Frequency of Communication with Friends and Family: Not on file  . Frequency of Social Gatherings with Friends and Family: Not on file  . Attends Religious Services: Not on file  . Active Member of Clubs or Organizations: Not on file  . Attends Archivist Meetings: Not on file  . Marital Status: Not on file    Vitals:   01/29/20 0830  BP: 132/80  Pulse: 90  Resp: 12  Temp: (!) 95.7 F (35.4 C)  SpO2: 98%    Body mass index is 26.93 kg/m.  Physical Exam  Nursing note and vitals reviewed. Constitutional: She is oriented to person, place, and time. She appears well-developed. No distress.  HENT:  Head: Normocephalic and atraumatic.  Mouth/Throat: Oropharynx is clear and moist and mucous membranes are normal.  Eyes: Pupils are equal, round, and reactive to light. Conjunctivae are normal.  Neck: No tracheal deviation present. No thyromegaly present.  Cardiovascular: Normal rate and regular rhythm.  No murmur heard. Pulses:      Dorsalis pedis pulses are 2+ on the right side and 2+ on the left side.  Respiratory: Effort normal and breath sounds normal. No respiratory distress.  GI: Soft. She exhibits no mass. There is no hepatomegaly. There is no abdominal tenderness.  Musculoskeletal:        General: No edema.     Thoracic back: No tenderness or bony tenderness.     Lumbar back: No tenderness or bony tenderness.     Comments: No signs of synovitis.  Lymphadenopathy:    She has no cervical adenopathy.  Neurological: She is alert and oriented to person, place, and time. She has normal strength. No cranial nerve deficit. Gait normal.  Skin: Skin is  warm. No rash noted. No erythema.  Psychiatric: She has a normal mood and affect.  Well groomed, good eye contact.    ASSESSMENT AND PLAN:  Ms. [name] was seen today for establish care.  Diagnoses and all orders for this visit:  Orders Placed This Encounter  Procedures  . Basic metabolic panel  . Hemoglobin A1c  . Lipid panel  . VITAMIN D 25 Hydroxy (Vit-D Deficiency, Fractures)  . TSH  . LDL cholesterol, direct  . Ambulatory referral to Gastroenterology   Lab Results  Component Value Date   TSH 3.27 01/29/2020   Lab Results  Component Value Date   CHOL 178 01/29/2020   HDL 47.00 01/29/2020  LDLCALC 118 (H) 03/14/2009   LDLDIRECT 116.0 01/29/2020   TRIG 217.0 (H) 01/29/2020   CHOLHDL 4 01/29/2020   Lab Results  Component Value Date   HGBA1C 5.6 01/29/2020    The 10-year ASCVD risk score Mikey Bussing DC Jr., et al., 2013) is: 2%   Values used to calculate the score:     Age: 66 years     Sex: Female     Is Non-Hispanic African American: No     Diabetic: No     Tobacco smoker: No     Systolic Blood Pressure: [id number] mmHg     Is BP treated: Yes     HDL Cholesterol: 47 mg/dL     Total Cholesterol: 178 mg/dL  Vitamin D deficiency, unspecified For now Vit D 800 U daily. Further recommendations according to 25 OH vit D.  Hypertension, essential, benign BP not adequately controlled. Possible complications of elevated BP discussed. Losartan increased from 25 mg to 50 mg daily. Continue monitoring BP regularly.  Hyperlipidemia, mixed Continue non pharmacologic treatment. We will follow labs done today and will give further recommendations accordingly.   Colon cancer screening -     Ambulatory referral to Gastroenterology  Hyperglycemia Healthy life style and wt low for primary prevention of diabetes.  -     Hemoglobin A1c  Other orders -     LDL cholesterol, direct   Return in about 6 months (around 07/28/2020) for HTN.     Jovonte Commins G. [place], MD  [facility]. [place] office.

## 2020-01-29 NOTE — Patient Instructions (Addendum)
A few things to remember from today's visit:   Hyperlipidemia, mixed - Plan: Lipid panel  Vitamin D deficiency, unspecified - Plan: VITAMIN D 25 Hydroxy (Vit-D Deficiency, Fractures)  Hypertension, essential, benign - Plan: Basic metabolic panel, TSH, losartan (COZAAR) 50 MG tablet  Colon cancer screening - Plan: Ambulatory referral to Gastroenterology  Hyperglycemia - Plan: Hemoglobin A1c  Bilateral low back pain without sciatica, unspecified chronicity  Losartan dose increased from 25 mg to 50 mg. Please be sure medication list is accurate. If a new problem present, please set up appointment sooner than planned today.

## 2020-01-29 NOTE — Assessment & Plan Note (Addendum)
BP not adequately controlled. Possible complications of elevated BP discussed. Losartan increased from 25 mg to 50 mg daily. Continue monitoring BP regularly.

## 2020-01-29 NOTE — Assessment & Plan Note (Signed)
Continue non pharmacologic treatment. We will follow labs done today and will give further recommendations accordingly.

## 2020-01-30 ENCOUNTER — Encounter: Payer: Self-pay | Admitting: Gastroenterology

## 2020-01-31 ENCOUNTER — Encounter: Payer: Self-pay | Admitting: Family Medicine

## 2020-02-15 ENCOUNTER — Telehealth: Payer: Self-pay

## 2020-02-15 NOTE — Telephone Encounter (Signed)
Call to pt re: missed Previsit appt, no voicemail, phone rings for a while and then hangs up. Unable to leave message.

## 2020-02-26 ENCOUNTER — Telehealth: Payer: Self-pay | Admitting: Family Medicine

## 2020-02-26 ENCOUNTER — Other Ambulatory Visit: Payer: Self-pay

## 2020-02-26 ENCOUNTER — Emergency Department (HOSPITAL_BASED_OUTPATIENT_CLINIC_OR_DEPARTMENT_OTHER)
Admission: EM | Admit: 2020-02-26 | Discharge: 2020-02-26 | Disposition: A | Discharge status: Home / Self Care | Payer: 59 | Attending: Emergency Medicine | Admitting: Emergency Medicine

## 2020-02-26 ENCOUNTER — Encounter (HOSPITAL_BASED_OUTPATIENT_CLINIC_OR_DEPARTMENT_OTHER): Payer: Self-pay | Admitting: *Deleted

## 2020-02-26 ENCOUNTER — Emergency Department (HOSPITAL_BASED_OUTPATIENT_CLINIC_OR_DEPARTMENT_OTHER): Payer: 59

## 2020-02-26 DIAGNOSIS — I10 Essential (primary) hypertension: Secondary | ICD-10-CM | POA: Insufficient documentation

## 2020-02-26 DIAGNOSIS — M25512 Pain in left shoulder: Secondary | ICD-10-CM | POA: Insufficient documentation

## 2020-02-26 DIAGNOSIS — R519 Headache, unspecified: Secondary | ICD-10-CM | POA: Diagnosis not present

## 2020-02-26 DIAGNOSIS — R42 Dizziness and giddiness: Secondary | ICD-10-CM | POA: Diagnosis not present

## 2020-02-26 HISTORY — DX: Essential (primary) hypertension: I10

## 2020-02-26 HISTORY — DX: Urinary tract infection, site not specified: N39.0

## 2020-02-26 HISTORY — DX: Rheumatoid arthritis, unspecified: M06.9

## 2020-02-26 LAB — TROPONIN I (HIGH SENSITIVITY): Troponin I (High Sensitivity): 2 ng/L (ref <18)

## 2020-02-26 LAB — BASIC METABOLIC PANEL
Anion gap: 11 (ref 5–15)
BUN: 11 mg/dL (ref 6–20)
CO2: 26 mmol/L (ref 22–32)
Calcium: 9.7 mg/dL (ref 8.9–10.3)
Chloride: 101 mmol/L (ref 98–111)
Creatinine, Ser: 0.7 mg/dL (ref 0.44–1.00)
GFR calc Af Amer: >60 mL/min (ref >60)
GFR calc non Af Amer: >60 mL/min (ref >60)
Glucose, Bld: 107 mg/dL — ABNORMAL HIGH (ref 70–99)
Potassium: 3.8 mmol/L (ref 3.5–5.1)
Sodium: 138 mmol/L (ref 135–145)

## 2020-02-26 LAB — CBC
HCT: 41.8 % (ref 36.0–46.0)
Hemoglobin: 13.6 g/dL (ref 12.0–15.0)
MCH: 29.4 pg (ref 26.0–34.0)
MCHC: 32.5 g/dL (ref 30.0–36.0)
MCV: 90.5 fL (ref 80.0–100.0)
Platelets: 304 10*3/uL (ref 150–400)
RBC: 4.62 MIL/uL (ref 3.87–5.11)
RDW: 13.4 % (ref 11.5–15.5)
WBC: 10 10*3/uL (ref 4.0–10.5)
nRBC: 0 % (ref 0.0–0.2)

## 2020-02-26 LAB — HCG, SERUM, QUALITATIVE: Preg, Serum: NEGATIVE

## 2020-02-26 NOTE — Discharge Instructions (Addendum)
You were evaluated in the Emergency Department and after careful evaluation, we did not find any emergent condition requiring admission or further testing in the hospital.  Your exam/testing today was overall reassuring.  Please follow-up with your primary care doctor to discuss your high blood pressure.  Please return to the Emergency Department if you experience any worsening of your condition.  We encourage you to follow up with a primary care provider.  Thank you for allowing [place] to be a part of your care.

## 2020-02-26 NOTE — Telephone Encounter (Signed)
Spoke with pt's husband. Hospital wanted pt to follow up with pcp to adjust blood pressure meds. Appt made for in office for tomorrow at 2pm.

## 2020-02-26 NOTE — Telephone Encounter (Signed)
Pt husband called and wanted to let Dr. Martinique know he is taking pt to urgent care due to her blood pressure being high. He would like a call back.

## 2020-02-26 NOTE — Telephone Encounter (Signed)
I called and left pt's husband a voicemail letting him know that we will review the ED notes & for him to call back with any questions.

## 2020-02-26 NOTE — ED Provider Notes (Addendum)
[facility] Emergency Department Provider Note MRN:  [id number]  Arrival date & time: 02/26/20     Chief Complaint   Hypertension   History of Present Illness   Tracey Wood is a 54 y.o. year-old female with a history of hypertension, rheumatoid arthritis presenting to the ED with chief complaint of hypertension.  About 2 hours prior to arrival while patient was at work, she felt lightheaded, generally unwell, pressure behind the eyes.  Felt very tired.  Checked her blood pressure and it was 220/104.  Took her morning blood pressure medication.  Since then has been slowly feeling better and is now back to normal.  Denies ever experiencing chest pain or shortness of breath, denies headache, no vision change, no abdominal pain, no recent fever or cough or illness, no change to medications or diet.  No numbness or weakness to the arms or legs.  Denies room spinning sensation.  Review of Systems  A complete 10 system review of systems was obtained and all systems are negative except as noted in the HPI and PMH.   Patient's Health History    Past Medical History:  Diagnosis Date  . Hypertension   . Rheumatoid arthritis ([place])   . Thyroid disease   . UTI (urinary tract infection)     Past Surgical History:  Procedure Laterality Date  . CATARACT EXTRACTION     R & L  . TUBAL LIGATION      Family History  Problem Relation Age of Onset  . Breast cancer Maternal Grandmother 53    Social History   Socioeconomic History  . Marital status: Married    Spouse name: Not on file  . Number of children: Not on file  . Years of education: Not on file  . Highest education level: Not on file  Occupational History  . Not on file  Tobacco Use  . Smoking status: Never Smoker  . Smokeless tobacco: Never Used  Substance and Sexual Activity  . Alcohol use: No    Alcohol/week: 0.0 standard drinks  . Drug use: No  . Sexual activity: Yes    Partners:  Male    Birth control/protection: Other-see comments    Comment: 1st intercourse- 41, partners- 2, married- 19 yrs   Other Topics Concern  . Not on file  Social History Narrative  . Not on file   Social Determinants of Health   Financial Resource Strain:   . Difficulty of Paying Living Expenses: Not on file  Food Insecurity:   . Worried About Charity fundraiser in the Last Year: Not on file  . Ran Out of Food in the Last Year: Not on file  Transportation Needs:   . Lack of Transportation (Medical): Not on file  . Lack of Transportation (Non-Medical): Not on file  Physical Activity:   . Days of Exercise per Week: Not on file  . Minutes of Exercise per Session: Not on file  Stress:   . Feeling of Stress : Not on file  Social Connections:   . Frequency of Communication with Friends and Family: Not on file  . Frequency of Social Gatherings with Friends and Family: Not on file  . Attends Religious Services: Not on file  . Active Member of Clubs or Organizations: Not on file  . Attends Archivist Meetings: Not on file  . Marital Status: Not on file  Intimate Partner Violence:   . Fear of Current or Ex-Partner: Not on  file  . Emotionally Abused: Not on file  . Physically Abused: Not on file  . Sexually Abused: Not on file     Physical Exam   Vitals:   02/26/20 0827  BP: (!) 214/99  Pulse: 84  Temp: 97.8 F (36.6 C)  SpO2: 100%    CONSTITUTIONAL: Well-appearing, NAD NEURO:  Alert and oriented x 3, normal and symmetric strength and sensation, normal coordination, normal speech, no visual field cuts, no neglect EYES:  eyes equal and reactive ENT/NECK:  no LAD, no JVD CARDIO: Regular rate, well-perfused, normal S1 and S2 PULM:  CTAB no wheezing or rhonchi GI/GU:  normal bowel sounds, non-distended, non-tender MSK/SPINE:  No gross deformities, no edema SKIN:  no rash, atraumatic PSYCH:  Appropriate speech and behavior  *Additional and/or pertinent findings  included in MDM below  Diagnostic and Interventional Summary    EKG Interpretation  Date/Time:  February 26, 2020 at 8: 37: 11 Ventricular Rate:  66 PR Interval:  178 QRS Duration: 98 QT Interval:  393 QTC Calculation: 412 R Axis:     Text Interpretation: Sinus rhythm, normal axis, normal intervals, no ischemic changes Confirmed with Dr. Gerlene Fee at 9:56 AM      Cardiac Monitoring Interpretation: Cardiac monitoring was ordered to monitor the patient for dysrhythmia.  I personally interpreted the patient's cardiac monitor while at the bedside. 02/26/2020 11:01 AM Sinus  Labs Reviewed  BASIC METABOLIC PANEL - Abnormal; Notable for the following components:      Result Value   Glucose, Bld 107 (*)    All other components within normal limits  CBC  HCG, SERUM, QUALITATIVE  TROPONIN I (HIGH SENSITIVITY)    CT Head Wo Contrast  Final Result      Medications - No data to display   Procedures  /  Critical Care Procedures  ED Course and Medical Decision Making  I have reviewed the triage vital signs, the nursing notes, and pertinent available records from the EMR.  Pertinent labs & imaging results that were available during my care of the patient were reviewed by me and considered in my medical decision making (see below for details).     Lightheadedness, denies vertiginous symptoms, reassuring neurological exam, all in the setting of hypertension, improved here in the emergency department and currently asymptomatic.  Never experienced chest pain.  Endorses elevated blood pressures for the past several days, will obtain screening kidney function, EKG, monitor for return of symptoms.  Currently without indication for CNS imaging.  10 AM update: Upon reassessment patient remains hypertensive, 180s over 90s.  She is now complaining of left shoulder pain that is unchanged with motion, also complaining of dull headache.  We will add on CT head to exclude hypertensive bleed, will add  on troponin.  11 AM update: CT head is unremarkable, troponin is negative.  No evidence of endorgan damage or dysfunction, continued reassuring exam with blood pressure now 164/80.  Doubt emergent process, appropriate for discharge with PCP follow-up for improved blood pressure control.  Barth Kirks. Sedonia Small, MD [place] [email or web address]  Final Clinical Impressions(s) / ED Diagnoses     ICD-10-CM   1. Hypertension, unspecified type  I10   2. Nonintractable headache, unspecified chronicity pattern, unspecified headache type  R51.9   3. Left shoulder pain, unspecified chronicity  M25.512   4. [place]     ED Discharge Orders    None       Discharge  Instructions Discussed with and Provided to Patient:     Discharge Instructions     You were evaluated in the Emergency Department and after careful evaluation, we did not find any emergent condition requiring admission or further testing in the hospital.  Your exam/testing today was overall reassuring.  Please follow-up with your primary care doctor to discuss your high blood pressure.  Please return to the Emergency Department if you experience any worsening of your condition.  We encourage you to follow up with a primary care provider.  Thank you for allowing [place] to be a part of your care.        Maudie Flakes, MD 02/26/20 1101    Maudie Flakes, MD 02/26/20 1102

## 2020-02-26 NOTE — ED Triage Notes (Signed)
Patient is feeling dizzy and she checked her BP 220/104.  Took her BP med PTA.

## 2020-02-27 ENCOUNTER — Ambulatory Visit (INDEPENDENT_AMBULATORY_CARE_PROVIDER_SITE_OTHER): Payer: 59 | Admitting: Family Medicine

## 2020-02-27 ENCOUNTER — Encounter: Payer: Self-pay | Admitting: Family Medicine

## 2020-02-27 VITALS — BP 150/80 | HR 86 | Resp 12 | Ht 62.0 in | Wt 146.5 lb

## 2020-02-27 DIAGNOSIS — R413 Other amnesia: Secondary | ICD-10-CM | POA: Diagnosis not present

## 2020-02-27 DIAGNOSIS — M791 Myalgia, unspecified site: Secondary | ICD-10-CM

## 2020-02-27 DIAGNOSIS — R55 Syncope and collapse: Secondary | ICD-10-CM

## 2020-02-27 DIAGNOSIS — I10 Essential (primary) hypertension: Secondary | ICD-10-CM | POA: Diagnosis not present

## 2020-02-27 LAB — BASIC METABOLIC PANEL
BUN: 11 mg/dL (ref 6–23)
CO2: 28 mEq/L (ref 19–32)
Calcium: 10 mg/dL (ref 8.4–10.5)
Chloride: 97 mEq/L (ref 96–112)
Creatinine, Ser: 0.65 mg/dL (ref 0.40–1.20)
GFR: 95.92 mL/min (ref >60.00)
Glucose, Bld: 133 mg/dL — ABNORMAL HIGH (ref 70–99)
Potassium: 3.7 mEq/L (ref 3.5–5.1)
Sodium: 134 mEq/L — ABNORMAL LOW (ref 135–145)

## 2020-02-27 LAB — CK: Total CK: 51 U/L (ref 7–177)

## 2020-02-27 MED ORDER — AMLODIPINE BESY-BENAZEPRIL HCL 5-10 MG PO CAPS: 1.0000 | ORAL_CAPSULE | Freq: Every day | ORAL | 1 refills | Status: DC
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
## 2020-02-27 NOTE — Patient Instructions (Signed)
A few things to remember from today's visit:   Hypertension, essential, benign - Plan: Basic metabolic panel  Transient amnesia - Plan: Ambulatory referral to Neurology, MR Brain W Wo Contrast  Myalgia - Plan: CK  Syncope, unspecified syncope type - Plan: Ambulatory referral to Neurology, MR Brain W Wo Contrast  No driving. Monitor blood pressure. Rest.  Please be sure medication list is accurate. If a new problem present, please set up appointment sooner than planned today.

## 2020-02-29 ENCOUNTER — Encounter: Payer: 59 | Admitting: Gastroenterology

## 2020-02-29 ENCOUNTER — Encounter: Payer: Self-pay | Admitting: Family Medicine

## 2020-02-29 ENCOUNTER — Encounter: Payer: Self-pay | Admitting: Neurology

## 2020-03-01 ENCOUNTER — Encounter: Payer: Self-pay | Admitting: Family Medicine

## 2020-03-03 ENCOUNTER — Encounter: Payer: Self-pay | Admitting: Family Medicine

## 2020-03-04 ENCOUNTER — Telehealth: Payer: Self-pay | Admitting: Family Medicine

## 2020-03-04 NOTE — Telephone Encounter (Signed)
Forms received.  

## 2020-03-04 NOTE — Telephone Encounter (Signed)
Attending Physician's Disability Statement to be filled out- placed in dr's folder.  Call [phone] upon completion.

## 2020-03-05 ENCOUNTER — Other Ambulatory Visit: Payer: Self-pay

## 2020-03-05 ENCOUNTER — Ambulatory Visit (AMBULATORY_SURGERY_CENTER): Payer: Self-pay | Admitting: *Deleted

## 2020-03-05 VITALS — Temp 97.7°F | Ht 62.0 in | Wt 148.0 lb

## 2020-03-05 DIAGNOSIS — Z01818 Encounter for other preprocedural examination: Secondary | ICD-10-CM

## 2020-03-05 DIAGNOSIS — Z1211 Encounter for screening for malignant neoplasm of colon: Secondary | ICD-10-CM

## 2020-03-05 MED ORDER — NA SULFATE-K SULFATE-MG SULF 17.5-3.13-1.6 GM/177ML PO SOLN: ORAL | 0 refills | Status: DC

## 2020-03-05 NOTE — Progress Notes (Signed)
Patient is here in-person for PV. Husband with the patient to be the interpreter (his temp 97.5). Patient denies any allergies to eggs or soy. Patient denies any problems with anesthesia/sedation. Patient denies any oxygen use at home. Patient denies taking any diet/weight loss medications or blood thinners. Patient is not being treated for MRSA or C-diff. EMMI education assisgned to the patient for the procedure, this was explained and instructions given to patient. COVID-19 screening test is on 4/6, the pt is aware.  Patient is aware of our care-partner policy and 0000000 safety regulations.   Prep Prescription coupon given to the patient.

## 2020-03-05 NOTE — Telephone Encounter (Signed)
Form placed in pcp's to be signed folder on desk.

## 2020-03-10 NOTE — Telephone Encounter (Signed)
I tried contacting patient to let her know the form is completed. I placed it up front to be picked up, patient has an appointment with pcp on Wednesday.
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
## 2020-03-11 ENCOUNTER — Other Ambulatory Visit: Payer: Self-pay

## 2020-03-11 ENCOUNTER — Ambulatory Visit: Payer: 59 | Admitting: Neurology

## 2020-03-11 ENCOUNTER — Encounter: Payer: Self-pay | Admitting: Neurology

## 2020-03-11 VITALS — BP 146/85 | HR 77 | Resp 18 | Ht 62.0 in | Wt 151.0 lb

## 2020-03-11 DIAGNOSIS — I10 Essential (primary) hypertension: Secondary | ICD-10-CM

## 2020-03-11 DIAGNOSIS — R404 Transient alteration of awareness: Secondary | ICD-10-CM

## 2020-03-11 NOTE — Patient Instructions (Signed)
1.  We will order an EEG to record your brainwaves. 2.  Dr. Martinique ordered an MRI of the brain.  Let her know that you have not yet been contacted to schedule an appointment. 3.  As per [facility], you should not drive for 6 months after episode of unconsciousness for unknown cause. 4.  Follow up after testing (may be virtual visit).

## 2020-03-12 ENCOUNTER — Encounter: Payer: Self-pay | Admitting: Family Medicine

## 2020-03-12 ENCOUNTER — Ambulatory Visit: Payer: 59 | Admitting: Family Medicine

## 2020-03-12 VITALS — BP 128/70 | HR 97 | Resp 12 | Ht 62.0 in | Wt 151.0 lb

## 2020-03-12 DIAGNOSIS — I10 Essential (primary) hypertension: Secondary | ICD-10-CM

## 2020-03-12 DIAGNOSIS — R519 Headache, unspecified: Secondary | ICD-10-CM | POA: Diagnosis not present

## 2020-03-12 LAB — BASIC METABOLIC PANEL
BUN: 11 mg/dL (ref 6–23)
CO2: 30 mEq/L (ref 19–32)
Calcium: 9.7 mg/dL (ref 8.4–10.5)
Chloride: 98 mEq/L (ref 96–112)
Creatinine, Ser: 0.65 mg/dL (ref 0.40–1.20)
GFR: 95.91 mL/min (ref >60.00)
Glucose, Bld: 101 mg/dL — ABNORMAL HIGH (ref 70–99)
Potassium: 4.3 mEq/L (ref 3.5–5.1)
Sodium: 135 mEq/L (ref 135–145)

## 2020-03-12 MED ORDER — AMLODIPINE BESY-BENAZEPRIL HCL 5-20 MG PO CAPS: 1.0000 | ORAL_CAPSULE | Freq: Every day | ORAL | 1 refills | Status: DC

## 2020-03-12 NOTE — Patient Instructions (Signed)
A few things to remember from today's visit:   Hoy aumentamos la dosis del medicamento, 5-20 mg. Continue tomandose la presion arterial.  Plan de alimentacin DASH DASH Eating Plan DASH es la sigla en ingls de "Enfoques Alimentarios para Detener la Hipertensin" (Dietary Approaches to Stop Hypertension). El plan de alimentacin DASH ha demostrado bajar la presin arterial elevada (hipertensin). Tambin puede reducir [employer] de diabetes tipo 2, enfermedad cardaca y accidente cerebrovascular. Este plan tambin puede ayudar a Horticulturist, commercial. Consejos para seguir este plan  Pautas generales  Evite ingerir ms de 2,300 mg (miligramos) de sal (sodio) por da. Si tiene hipertensin, es posible que necesite reducir la ingesta de sodio a 1,500 mg por da.  Limite el consumo de alcohol a no ms de 67medida por da si es mujer y no est [place], y 87medidas por da si es hombre. Una medida equivale a 12oz (340ml) de cerveza, 5oz (135ml) de vino o 1oz (85ml) de bebidas alcohlicas de alta graduacin.  Trabaje con su mdico para mantener un peso saludable o perder [employer]. Pregntele cul es el peso recomendado para usted.  Realice al menos 30 minutos de ejercicio que haga que se acelere su corazn (ejercicio Arboriculturist) la [employer] de la [place]. Estas actividades pueden incluir caminar, nadar o andar en bicicleta.  Trabaje con su mdico o especialista en alimentacin y nutricin (nutricionista) para ajustar su plan alimentario a sus necesidades calricas personales. Lectura de las etiquetas de los alimentos   Verifique en las etiquetas de los alimentos, la cantidad de sodio por porcin. Elija alimentos con menos del 5 por ciento del valor diario de sodio. Generalmente, los alimentos con menos de 300 mg de sodio por porcin se encuadran dentro de este plan alimentario.  Para encontrar cereales integrales, busque la palabra "integral" como primera palabra en la lista de ingredientes. De  compras  Compre productos en los que en su etiqueta diga: "bajo contenido de sodio" o "sin agregado de sal".  Compre alimentos frescos. Evite los alimentos enlatados y comidas precocidas o congeladas. Coccin  Evite agregar sal cuando cocine. Use hierbas o aderezos sin sal, en lugar de sal de mesa o sal marina. Consulte al mdico o farmacutico antes de usar sustitutos de la sal.  No fra los alimentos. A la hora de cocinar los alimentos opte por hornearlos, hervirlos, grillarlos y asarlos a Administrator, arts.  Cocine con aceites cardiosaludables, como oliva, canola, soja o girasol. Planificacin de las comidas  Consuma una dieta equilibrada, que incluya lo siguiente: ? 5o ms porciones de frutas y Set designer. Trate de que la mitad del plato de cada comida sean frutas y verduras. ? Hasta 6 u 8 porciones de cereales integrales por da. ? Menos de 6 onzas de carne, aves o pescado Games developer. Una porcin de 3 onzas de carne tiene casi el mismo tamao que un mazo de cartas. Un huevo equivale a 1 onza. ? Dos porciones de productos lcteos descremados por Training and development officer. ? Una porcin de frutos secos, semillas o frijoles 5 veces por semana. ? Grasas cardiosaludables. Las grasas saludables llamadas cidos grasos omega-3 se encuentran en alimentos como semillas de lino y pescados de agua fra, como por ejemplo, sardinas, salmn y caballa.  Limite la cantidad que ingiere de los siguientes alimentos: ? Alimentos enlatados o envasados. ? Alimentos con alto contenido de grasa trans, como alimentos fritos. ? Alimentos con alto contenido de grasa saturada, como carne con grasa. ? Dulces, postres, bebidas azucaradas y  otros alimentos con Holiday representative. ? Productos lcteos enteros.  No le agregue sal a los alimentos antes de probarlos.  Trate de comer al menos 2 comidas vegetarianas por semana.  Consuma ms comida casera y menos de restaurante, de bufs y comida rpida.  Cuando coma en un restaurante,  pida que preparen su comida con menos sal o, en lo posible, sin nada de sal. Qu alimentos se recomiendan? Los alimentos enumerados a continuacin no constituyen Furniture conservator/restorer. Hable con el nutricionista sobre las mejores opciones alimenticias para usted. Cereales Pan de salvado o integral. Pasta de salvado o integral. Arroz integral. Avena. Quinua. Trigo burgol. Cereales integrales y con bajo contenido de sodio. Pan pita. Galletitas de [place] con bajo contenido de [place] y [place]. Tortillas de [place] integral. Verduras Verduras frescas o congeladas (crudas, al vapor, asadas o grilladas). Jugos de tomate y verduras con bajo contenido de sodio o reducidos en sodio. Salsa y pasta de tomate con bajo contenido de sodio o reducidas en sodio. Verduras enlatadas con bajo contenido de sodio o reducidas en sodio. Frutas Todas las frutas frescas, congeladas o disecadas. Frutas enlatadas en jugo natural (sin agregado de azcar). Carne y otros alimentos proteicos Pollo o pavo sin piel. Carne de pollo o de [place]. Cerdo desgrasado. Pescado y [employer]. Claras de huevo. Porotos, guisantes o lentejas secos. Frutos secos, mantequilla de frutos secos y semillas sin sal. Frijoles enlatados sin sal. Cortes de carne vacuna magra, desgrasada. Embutidos magros, con bajo contenido de [place]. Lcteos Leche descremada (1%) o descremada. Quesos sin grasa, con bajo contenido de grasa o descremados. Queso blanco o ricota sin grasa, con bajo contenido de [place]. Yogur semidescremado o descremado. Queso con bajo contenido de [place] y [place]. Grasas y [employer] untables que no contengan grasas trans. Aceite vegetal. Lubertha Basque y aderezos para ensaladas livianos o con bajo contenido de grasas (reducidos en sodio). Aceite de canola, crtamo, oliva, soja y [place]. [place]. Condimentos y otros alimentos Hierbas. Especias. Mezclas de condimentos sin sal. Palomitas de maz y pretzels sin sal. Dulces con bajo contenido de  grasas. Qu alimentos no se recomiendan? Los alimentos enumerados a continuacin no constituyen Furniture conservator/restorer. Hable con el nutricionista sobre las mejores opciones alimenticias para usted. Cereales Productos de panificacin hechos con grasa, como medialunas, magdalenas y algunos panes. Comidas con arroz o pasta seca listas para usar. Verduras Verduras con crema o fritas. Verduras en [place]. Verduras enlatadas regulares (que no sean con bajo contenido de sodio o reducidas en sodio). Pasta y salsa de tomates enlatadas regulares (que no sean con bajo contenido de sodio o reducidas en sodio). Jugos de tomate y verduras regulares (que no sean con bajo contenido de sodio o reducidos en sodio). Pepinillos. Aceitunas. Lambert Mody Fruta enlatada en almbar liviano o espeso. Frutas cocidas en aceite. Frutas con salsa de crema o [place]. Carne y otros alimentos proteicos Cortes de carne con grasa. Costillas. Carne frita. [place]. Salchichas. Mortadela y otras carnes procesadas. Salame. Panceta. Perros calientes (hotdogs). [place]. Frutos secos y semillas con sal. Frijoles enlatados con agregado de sal. Pescado enlatado o ahumado. Huevos enteros o yemas. Pollo o pavo con piel. Lcteos Leche entera o al 2%, crema y mitad leche y mitad crema. Queso crema entero o con toda su grasa. Yogur entero o endulzado. Quesos con toda su grasa. Sustitutos de cremas no lcteas. Coberturas batidas. Quesos para untar y quesos procesados. Grasas y [employer]. Margarina en barra. [place]. Materia grasa. Mantequilla clarificada.  Grasa de panceta. Aceites tropicales como aceite de coco, palmiste o palma. Condimentos y otros alimentos Palomitas de maz y pretzels con sal. Sal de cebolla, sal de ajo, sal condimentada, sal de mesa y sal marina. Salsa [place]. Salsa trtara. Salsa barbacoa. Salsa teriyaki. Salsa de soja, incluso la que tiene contenido reducido de [place]. Salsa de carne.  Salsas en lata y envasadas. Salsa de pescado. Salsa de [place]. Salsa rosada. Rbano picante envasado. [place]. [place]. Saborizantes y tiernizantes para carne. Caldo en cubitos. Salsa picante y salsa tabasco. Escabeches envasados o ya preparados. Aderezos para tacos prefabricados o envasados. Salsas. Aderezos comunes para ensalada. Dnde encontrar ms informacin:  [place], los Pulmones y Herbalist (National Heart, Lung, and [place]): https://wilson-eaton.com/  Asociacin Estadounidense del Corazn (American Heart Association): www.heart.org Resumen  El plan de alimentacin DASH ha demostrado bajar la presin arterial elevada (hipertensin). Tambin puede reducir UnitedHealth de diabetes tipo 2, enfermedad cardaca y accidente cerebrovascular.  Con el plan de alimentacin DASH, deber limitar el consumo de sal (sodio) a 2,300 mg por da. Si tiene hipertensin, es posible que necesite reducir la ingesta de sodio a 1,500 mg por da.  Cuando siga el plan de alimentacin DASH, trate de comer ms frutas frescas y verduras, cereales integrales, carnes magras, lcteos descremados y grasas cardiosaludables.  Trabaje con su mdico o especialista en alimentacin y nutricin (nutricionista) para ajustar su plan alimentario a sus necesidades calricas personales. Esta informacin no tiene Marine scientist el consejo del mdico. Asegrese de hacerle al mdico cualquier pregunta que tenga. Document Revised: 04/04/2017 Document Reviewed: 04/04/2017 Elsevier Patient Education  [place].  Please be sure medication list is accurate. If a new problem present, please set up appointment sooner than planned today.

## 2020-03-12 NOTE — Assessment & Plan Note (Signed)
BP is better controlled but is still having some > 130/80. Recommend increasing dose of benazepril from 10 mg to 20 mg, Lotrel 5-20 mg. Continue monitoring BP regularly. DASH diet recommended. She will bring BP monitor next visit.

## 2020-03-12 NOTE — Progress Notes (Signed)
HPI:   Tracey Wood is a 54 y.o. female, who is here today to follow on recent OV. Sine her last visit she has seen neurologist. She has not had another episode of syncope or amnesic episode. She is not driving.  +Constant retro-ocular pressure. Last eye exam 08/2019 to follow on cataract surgery. No associated photophobia,nausea,vomiting,conjunctival erythema, or diplopia. Not sure about exacerbating or alleviating factors.  Pending brain MRI.  Home BP 137/84, 104/72,109/78,147/94,137/87. She is on Lotrel 5-10 mg daily. Tolerating medication well.  Negative for visual changes, chest pain, dyspnea, palpitation, claudication, focal weakness, or edema.  Intermittent right-sided occipital headache x 2 :3 days ago and yesterday. Soreness, 6/10  Pain is mild and Tylenol helps. No associated symptoms.  Lab Results  Component Value Date   CREATININE 0.65 02/27/2020   BUN 11 02/27/2020   NA 134 (L) 02/27/2020   K 3.7 02/27/2020   CL 97 02/27/2020   CO2 28 02/27/2020   Review of Systems  Constitutional: Positive for fatigue (Improved). Negative for activity change, appetite change and fever.  HENT: Negative for mouth sores, nosebleeds and sore throat.   Eyes: Negative for redness and visual disturbance.  Respiratory: Negative for cough and wheezing.   Gastrointestinal: Negative for abdominal pain, nausea and vomiting.       Negative for changes in bowel habits.  Genitourinary: Negative for decreased urine volume and hematuria.  Neurological: Negative for syncope, facial asymmetry and speech difficulty.  Rest see pertinent positives and negatives per HPI.  Current Outpatient Medications on File Prior to Visit  Medication Sig Dispense Refill  . Abatacept (ORENCIA [place]) Inject into the skin.    . cyclobenzaprine (FLEXERIL) 10 MG tablet Take 10 mg by mouth at bedtime.    . meloxicam (MOBIC) 15 MG tablet Take 15 mg by mouth daily.    . Na Sulfate-K Sulfate-Mg  Sulf 17.5-3.13-1.6 GM/177ML SOLN Suprep (no substitutions)-TAKE AS DIRECTED. 354 mL 0  . Vitamin D, Ergocalciferol, (DRISDOL) 1.25 MG (50000 UT) CAPS capsule Take 50,000 Units by mouth every 7 (seven) days.     No current facility-administered medications on file prior to visit.   Past Medical History:  Diagnosis Date  . Bronchitis   . Cataract    had sx   . Hypertension   . Rheumatoid arthritis ([place])   . Thyroid disease   . UTI (urinary tract infection)    Allergies  Allergen Reactions  . Morphine And Related Other (See Comments)    Fast heart beat  . Ciprofloxacin Rash  . Oxycodone Palpitations  . Tape Dermatitis    Social History   Socioeconomic History  . Marital status: Married    Spouse name: Not on file  . Number of children: Not on file  . Years of education: Not on file  . Highest education level: Not on file  Occupational History  . Not on file  Tobacco Use  . Smoking status: Never Smoker  . Smokeless tobacco: Never Used  Substance and Sexual Activity  . Alcohol use: No    Alcohol/week: 0.0 standard drinks  . Drug use: No  . Sexual activity: Yes    Partners: Male    Birth control/protection: Other-see comments    Comment: 1st intercourse- 14, partners- 2, married- 76 yrs   Other Topics Concern  . Not on file  Social History Narrative   Right handed   One story   No caffeine x 2 weeks   Social Determinants of  Health   Financial Resource Strain:   . Difficulty of Paying Living Expenses:   Food Insecurity:   . Worried About Charity fundraiser in the Last Year:   . Arboriculturist in the Last Year:   Transportation Needs:   . Film/video editor (Medical):   Marland Kitchen Lack of Transportation (Non-Medical):   Physical Activity:   . Days of Exercise per Week:   . Minutes of Exercise per Session:   Stress:   . Feeling of Stress :   Social Connections:   . Frequency of Communication with Friends and Family:   . Frequency of Social Gatherings with  Friends and Family:   . Attends Religious Services:   . Active Member of Clubs or Organizations:   . Attends Archivist Meetings:   Marland Kitchen Marital Status:     Vitals:   03/12/20 0652  BP: 128/70  Pulse: 97  Resp: 12  SpO2: 97%   Body mass index is 27.62 kg/m.  Physical Exam  Nursing note and vitals reviewed. Constitutional: She is oriented to person, place, and time. She appears well-developed. No distress.  HENT:  Head: Normocephalic and atraumatic.  Mouth/Throat: Oropharynx is clear and moist and mucous membranes are normal.  Eyes: Pupils are equal, round, and reactive to light. Conjunctivae and EOM are normal.  Cardiovascular: Normal rate and regular rhythm.  No murmur heard. Pulses:      Dorsalis pedis pulses are 2+ on the right side and 2+ on the left side.  Respiratory: Effort normal and breath sounds normal. No respiratory distress.  GI: Soft. She exhibits no mass. There is no hepatomegaly. There is no abdominal tenderness.  Musculoskeletal:        General: No edema.  Lymphadenopathy:    She has no cervical adenopathy.  Neurological: She is alert and oriented to person, place, and time. She has normal strength. No cranial nerve deficit. Gait normal.  Skin: Skin is warm. No rash noted. No erythema.  Psychiatric: She has a normal mood and affect.  Well groomed, good eye contact.    ASSESSMENT AND PLAN:  Tracey Wood was seen today for follow-up.  Diagnoses and all orders for this visit:  Orders Placed This Encounter  Procedures  . Basic metabolic panel    Lab Results  Component Value Date   CREATININE 0.65 03/12/2020   BUN 11 03/12/2020   NA 135 03/12/2020   K 4.3 03/12/2020   CL 98 03/12/2020   CO2 30 03/12/2020   Headache, unspecified headache type History and examination today do not suggest a serious process. Continue Tylenol 500 mg 3 times per day as needed. Recommend arranging appointment with eye care provider. Brain MRI has already been  approved by her insurance.  I am expecting her to receive a phone call with information in the next couple days.  Hypertension, essential, benign BP is better controlled but is still having some > 130/80. Recommend increasing dose of benazepril from 10 mg to 20 mg, Lotrel 5-20 mg. Continue monitoring BP regularly. DASH diet recommended. She will bring BP monitor next visit.   Return in about 2 months (around 05/12/2020).   Zarion Oliff G. [place], MD  [facility]. [place] office.

## 2020-03-18 ENCOUNTER — Other Ambulatory Visit: Payer: Self-pay

## 2020-03-19 ENCOUNTER — Ambulatory Visit (INDEPENDENT_AMBULATORY_CARE_PROVIDER_SITE_OTHER): Payer: 59 | Admitting: Obstetrics & Gynecology

## 2020-03-19 ENCOUNTER — Encounter: Payer: Self-pay | Admitting: Obstetrics & Gynecology

## 2020-03-19 ENCOUNTER — Encounter: Payer: 59 | Admitting: Gastroenterology

## 2020-03-19 DIAGNOSIS — N8501 Benign endometrial hyperplasia: Secondary | ICD-10-CM | POA: Diagnosis not present

## 2020-03-19 NOTE — Progress Notes (Signed)
    Tracey Wood 01/14/1966 [id number]        54 y.o.  G3P0100   RP: Simple Hyperplasia without atypia 10/2019 for repeat Endometrial Biopsy  HPI: Spontaneous menses every month with stable flow on the heavy side since last visit in 10/2019.  Took the Progestin only once in 10/2019.  No pelvic pain.  Normal vaginal secretions.   OB History  Gravida Para Term Preterm AB Living  3 2 0 1   0  SAB TAB Ectopic Multiple Live Births  0 0 0 0      # Outcome Date GA Lbr Len/2nd Weight Sex Delivery Anes PTL Lv  3 Gravida           2 Para           1 Preterm             Past medical history,surgical history, problem list, medications, allergies, family history and social history were all reviewed and documented in the EPIC chart.   Directed ROS with pertinent positives and negatives documented in the history of present illness/assessment and plan.  Exam:  There were no vitals filed for this visit. General appearance:  Normal   Gynecologic exam: Vulva normal.  Speculum:  Cervix/Vagina normal.  No bleeding.  Normal secretions.  Betadine prep of cervix.  Hurricaine spray on the cervix.  Tenaculum applied on the anterior lip of the cervix.  Mild dilation of the cervix with the os finder.  Easy insertion of the cannula for endometrial biopsy.  Endometrial biopsy on all intrauterine surfaces by suction and scraping with the cannula.  Moderate amount of specimen.  Specimen sent to pathology.  Hemostasis was adequate.  Tenaculum and speculum removed.  Patient had a cramp during the biopsy but tolerated the procedure well.  No complication.   Assessment/Plan:  54 y.o. G3P0100   1. 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  Add normal spontaneous menses every month since then.  Endometrial biopsy repeated today.  Moderate amount of specimen.  Specimen sent to pathology.  Procedure well-tolerated.   Postprocedure precautions reviewed.  Management per results.  Other orders - Pathology Report ([facility])  [name] MD, 4:09 PM 03/19/2020

## 2020-03-19 NOTE — Patient Instructions (Signed)
1. 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  Add normal spontaneous menses every month since then.  Endometrial biopsy repeated today.  Moderate amount of specimen.  Specimen sent to pathology.  Procedure well-tolerated.  Postprocedure precautions reviewed.  Management per results.  Other orders - Pathology Report ([facility])  [place], it was a pleasure seeing you today!  I will inform you of your results as soon as they are available.

## 2020-03-20 ENCOUNTER — Other Ambulatory Visit: Payer: Self-pay | Admitting: Family Medicine

## 2020-03-21 ENCOUNTER — Other Ambulatory Visit: Payer: Self-pay | Admitting: *Deleted

## 2020-03-21 LAB — TISSUE SPECIMEN

## 2020-03-21 LAB — PATHOLOGY REPORT

## 2020-03-21 MED ORDER — NORETHINDRONE 0.35 MG PO TABS: 1.0000 | ORAL_TABLET | Freq: Every day | ORAL | 1 refills | Status: DC

## 2020-03-24 ENCOUNTER — Other Ambulatory Visit: Payer: Self-pay

## 2020-03-24 ENCOUNTER — Ambulatory Visit (INDEPENDENT_AMBULATORY_CARE_PROVIDER_SITE_OTHER): Payer: 59 | Admitting: Neurology

## 2020-03-24 DIAGNOSIS — R404 Transient alteration of awareness: Secondary | ICD-10-CM | POA: Diagnosis not present

## 2020-03-24 DIAGNOSIS — I10 Essential (primary) hypertension: Secondary | ICD-10-CM

## 2020-03-31 ENCOUNTER — Other Ambulatory Visit: Payer: Self-pay | Admitting: Gastroenterology

## 2020-03-31 NOTE — Procedures (Signed)
ELECTROENCEPHALOGRAM REPORT  Date of Study: 03/24/2020  Patient's Name: Tracey Wood MRN: [id number] Date of Birth: Jul 20, 1966   Clinical History: 54 year old female with syncope and confusion  Medications: ORENCIA [place] Inject into the skin LOTREL 5-10 MG capsule FLEXERIL 10 MG tablet MOBIC 15 MG tablet DRISDOL 1.25 MG (50000 UT) CAPS  Ergocalciferol, capsule  Technical Summary: A multichannel digital EEG recording measured by the international 10-20 system with electrodes applied with paste and impedances below 5000 ohms performed in our laboratory with EKG monitoring in an awake and asleep patient.  Hyperventilation not performed as patient wearing mask due to Covid-19.  Photic stimulation was performed.  The digital EEG was referentially recorded, reformatted, and digitally filtered in a variety of bipolar and referential montages for optimal display.    Description: The patient is awake and asleep during the recording.  During maximal wakefulness, there is a symmetric, medium voltage 10 Hz posterior dominant rhythm that attenuates with eye opening.  The record is symmetric.  During drowsiness and sleep, there is an increase in theta slowing of the background.  Vertex waves and symmetric sleep spindles were seen.  Photic stimulation did not elicit any abnormalities.  There were no epileptiform discharges or electrographic seizures seen.    EKG lead was unremarkable.  Impression: This awake and asleep EEG is normal.    Clinical Correlation: A normal EEG does not exclude a clinical diagnosis of epilepsy.  If further clinical questions remain, prolonged EEG may be helpful.  Clinical correlation is advised.   Metta Clines, DO

## 2020-04-01 ENCOUNTER — Ambulatory Visit (INDEPENDENT_AMBULATORY_CARE_PROVIDER_SITE_OTHER): Payer: 59

## 2020-04-01 ENCOUNTER — Encounter: Payer: Self-pay | Admitting: Family Medicine

## 2020-04-01 DIAGNOSIS — Z1159 Encounter for screening for other viral diseases: Secondary | ICD-10-CM

## 2020-04-02 LAB — SARS CORONAVIRUS 2 (TAT 6-24 HRS): SARS Coronavirus 2: NEGATIVE

## 2020-04-03 ENCOUNTER — Encounter: Payer: Self-pay | Admitting: Gastroenterology

## 2020-04-04 ENCOUNTER — Ambulatory Visit (AMBULATORY_SURGERY_CENTER): Payer: 59 | Admitting: Gastroenterology

## 2020-04-04 ENCOUNTER — Other Ambulatory Visit: Payer: Self-pay | Admitting: Family Medicine

## 2020-04-04 ENCOUNTER — Other Ambulatory Visit: Payer: Self-pay

## 2020-04-04 ENCOUNTER — Encounter: Payer: Self-pay | Admitting: Gastroenterology

## 2020-04-04 VITALS — BP 113/66 | HR 73 | Temp 96.6°F | Resp 17 | Ht 62.0 in | Wt 148.0 lb

## 2020-04-04 DIAGNOSIS — Z1211 Encounter for screening for malignant neoplasm of colon: Secondary | ICD-10-CM | POA: Diagnosis not present

## 2020-04-04 DIAGNOSIS — K635 Polyp of colon: Secondary | ICD-10-CM | POA: Diagnosis not present

## 2020-04-04 DIAGNOSIS — Z0189 Encounter for other specified special examinations: Secondary | ICD-10-CM

## 2020-04-04 DIAGNOSIS — D125 Benign neoplasm of sigmoid colon: Secondary | ICD-10-CM

## 2020-04-04 MED ORDER — SODIUM CHLORIDE 0.9 % IV SOLN: 500.0000 mL | Freq: Once | INTRAVENOUS | Status: DC

## 2020-04-04 NOTE — Patient Instructions (Signed)
YOU HAD AN ENDOSCOPIC PROCEDURE TODAY AT THE [facility]:   Refer to the procedure report that was given to you for any specific questions about what was found during the examination.  If the procedure report does not answer your questions, please call your gastroenterologist to clarify.  If you requested that your care partner not be given the details of your procedure findings, then the procedure report has been included in a sealed envelope for you to review at your convenience later.  YOU SHOULD EXPECT: Some feelings of bloating in the abdomen. Passage of more gas than usual.  Walking can help get rid of the air that was put into your GI tract during the procedure and reduce the bloating. If you had a lower endoscopy (such as a colonoscopy or flexible sigmoidoscopy) you may notice spotting of blood in your stool or on the toilet paper. If you underwent a bowel prep for your procedure, you may not have a normal bowel movement for a few days.  Please Note:  You might notice some irritation and congestion in your nose or some drainage.  This is from the oxygen used during your procedure.  There is no need for concern and it should clear up in a day or so.  SYMPTOMS TO REPORT IMMEDIATELY:   Following lower endoscopy (colonoscopy or flexible sigmoidoscopy):  Excessive amounts of blood in the stool  Significant tenderness or worsening of abdominal pains  Swelling of the abdomen that is new, acute  Fever of 100F or higher  For urgent or emergent issues, a gastroenterologist can be reached at any hour by calling [phone]. Do not use MyChart messaging for urgent concerns.    DIET:  We do recommend a small meal at first, but then you may proceed to your regular diet.  Drink plenty of fluids but you should avoid alcoholic beverages for 24 hours.  ACTIVITY:  You should plan to take it easy for the rest of today and you should NOT DRIVE or use heavy machinery until tomorrow (because  of the sedation medicines used during the test).    FOLLOW UP: Our staff will call the number listed on your records 48-72 hours following your procedure to check on you and address any questions or concerns that you may have regarding the information given to you following your procedure. If we do not reach you, we will leave a message.  We will attempt to reach you two times.  During this call, we will ask if you have developed any symptoms of COVID 19. If you develop any symptoms (ie: fever, flu-like symptoms, shortness of breath, cough etc.) before then, please call [phone].  If you test positive for Covid 19 in the 2 weeks post procedure, please call and report this information to us.    If any biopsies were taken you will be contacted by phone or by letter within the next 1-3 weeks.  Please call us at [phone] if you have not heard about the biopsies in 3 weeks.    SIGNATURES/CONFIDENTIALITY: You and/or your care partner have signed paperwork which will be entered into your electronic medical record.  These signatures attest to the fact that that the information above on your After Visit Summary has been reviewed and is understood.  Full responsibility of the confidentiality of this discharge information lies with you and/or your care-partner. 

## 2020-04-04 NOTE — Progress Notes (Signed)
Called to room to assist during endoscopic procedure.  Patient ID and intended procedure confirmed with present staff. Received instructions for my participation in the procedure from the performing physician.  

## 2020-04-04 NOTE — Progress Notes (Signed)
To PACU< VSS. Report to [email or web address] 

## 2020-04-04 NOTE — Progress Notes (Signed)
Pt's states no medical or surgical changes since previsit or office visit. 

## 2020-04-04 NOTE — Progress Notes (Signed)
pati

## 2020-04-04 NOTE — Op Note (Signed)
[facility] Patient Name: Tracey Wood Procedure Date: 04/04/2020 9:15 AM MRN: [id number] Endoscopist: Mauri Pole , MD Age: 54 Referring MD:  Date of Birth: February 19, 1966 Gender: Female Account #: [id number] Procedure:                Colonoscopy Indications:              Screening for colorectal malignant neoplasm Medicines:                Monitored Anesthesia Care Procedure:                Pre-Anesthesia Assessment:                           - Prior to the procedure, a History and Physical                            was performed, and patient medications and                            allergies were reviewed. The patient's tolerance of                            previous anesthesia was also reviewed. The risks                            and benefits of the procedure and the sedation                            options and risks were discussed with the patient.                            All questions were answered, and informed consent                            was obtained. Prior Anticoagulants: The patient has                            taken no previous anticoagulant or antiplatelet                            agents. ASA Grade Assessment: II - A patient with                            mild systemic disease. After reviewing the risks                            and benefits, the patient was deemed in                            satisfactory condition to undergo the procedure.                           After obtaining informed consent, the colonoscope  was passed under direct vision. Throughout the                            procedure, the patient's blood pressure, pulse, and                            oxygen saturations were monitored continuously. The                            Colonoscope was introduced through the anus and                            advanced to the the cecum, identified by                            appendiceal orifice  and ileocecal valve. The                            colonoscopy was performed without difficulty. The                            patient tolerated the procedure well. The quality                            of the bowel preparation was good. The ileocecal                            valve, appendiceal orifice, and rectum were                            photographed. Scope In: 9:23:25 AM Scope Out: 9:43:06 AM Scope Withdrawal Time: 0 hours 12 minutes 24 seconds  Total Procedure Duration: 0 hours 19 minutes 41 seconds  Findings:                 The perianal and digital rectal examinations were                            normal.                           Two sessile polyps were found in the sigmoid colon.                            The polyps were 1 to 2 mm in size. These polyps                            were removed with a cold biopsy forceps. Resection                            and retrieval were complete.                           Non-bleeding internal hemorrhoids were found during  retroflexion. The hemorrhoids were small. Complications:            No immediate complications. Estimated Blood Loss:     Estimated blood loss was minimal. Impression:               - Two 1 to 2 mm polyps in the sigmoid colon,                            removed with a cold biopsy forceps. Resected and                            retrieved.                           - Non-bleeding internal hemorrhoids. Recommendation:           - Patient has a contact number available for                            emergencies. The signs and symptoms of potential                            delayed complications were discussed with the                            patient. Return to normal activities tomorrow.                            Written discharge instructions were provided to the                            patient.                           - Resume previous diet.                           -  Continue present medications.                           - Await pathology results.                           - Repeat colonoscopy in 5-10 years for surveillance                            based on pathology results. Mauri Pole, MD 04/04/2020 9:48:33 AM This report has been signed electronically.

## 2020-04-08 ENCOUNTER — Other Ambulatory Visit (INDEPENDENT_AMBULATORY_CARE_PROVIDER_SITE_OTHER): Payer: 59

## 2020-04-08 ENCOUNTER — Telehealth: Payer: Self-pay

## 2020-04-08 ENCOUNTER — Other Ambulatory Visit: Payer: Self-pay

## 2020-04-08 DIAGNOSIS — Z0189 Encounter for other specified special examinations: Secondary | ICD-10-CM

## 2020-04-08 NOTE — Telephone Encounter (Signed)
First post procedure follow up call, no answer 

## 2020-04-09 LAB — SARS-COV-2 IGG: SARS-COV-2 IgG: 0.55

## 2020-04-10 ENCOUNTER — Ambulatory Visit
Admission: RE | Admit: 2020-04-10 | Discharge: 2020-04-10 | Disposition: A | Discharge status: Home / Self Care | Payer: 59 | Source: Ambulatory Visit | Attending: Family Medicine | Admitting: Family Medicine

## 2020-04-10 DIAGNOSIS — R413 Other amnesia: Secondary | ICD-10-CM

## 2020-04-10 DIAGNOSIS — R55 Syncope and collapse: Secondary | ICD-10-CM

## 2020-04-10 MED ORDER — GADOBENATE DIMEGLUMINE 529 MG/ML IV SOLN
15.0000 mL | Freq: Once | INTRAVENOUS | Status: AC | PRN
  Administered 2020-04-10: 15 mL via INTRAVENOUS

## 2020-04-14 ENCOUNTER — Encounter: Payer: Self-pay | Admitting: Family Medicine

## 2020-04-14 ENCOUNTER — Telehealth: Payer: Self-pay | Admitting: Family Medicine

## 2020-04-14 NOTE — Telephone Encounter (Signed)
Form placed on providers desk for completion

## 2020-04-14 NOTE — Telephone Encounter (Signed)
Physician Disability Statement Form to be filled out--placed in dr's folder.  Fax to [phone], attn: [place] [employer] banker, operational

## 2020-04-15 ENCOUNTER — Encounter: Payer: Self-pay | Admitting: Gastroenterology

## 2020-04-18 ENCOUNTER — Encounter: Payer: Self-pay | Admitting: Family Medicine

## 2020-05-09 ENCOUNTER — Ambulatory Visit: Payer: 59 | Admitting: Family Medicine

## 2020-05-09 ENCOUNTER — Other Ambulatory Visit: Payer: Self-pay

## 2020-05-09 ENCOUNTER — Encounter: Payer: Self-pay | Admitting: Family Medicine

## 2020-05-09 ENCOUNTER — Telehealth: Payer: Self-pay

## 2020-05-09 VITALS — BP 120/64 | HR 73 | Temp 97.8°F | Resp 12 | Ht 62.0 in | Wt 149.0 lb

## 2020-05-09 DIAGNOSIS — D899 Disorder involving the immune mechanism, unspecified: Secondary | ICD-10-CM

## 2020-05-09 DIAGNOSIS — H539 Unspecified visual disturbance: Secondary | ICD-10-CM

## 2020-05-09 DIAGNOSIS — I10 Essential (primary) hypertension: Secondary | ICD-10-CM | POA: Diagnosis not present

## 2020-05-09 DIAGNOSIS — R404 Transient alteration of awareness: Secondary | ICD-10-CM | POA: Diagnosis not present

## 2020-05-09 MED ORDER — AMLODIPINE BESY-BENAZEPRIL HCL 5-20 MG PO CAPS: 1.0000 | ORAL_CAPSULE | Freq: Every day | ORAL | 2 refills | Status: DC

## 2020-05-09 NOTE — Assessment & Plan Note (Signed)
Adequately controlled. No changes in current management. DASH diet recommended. Eye exam recommended annually. F/U in 6 months, before if needed.

## 2020-05-09 NOTE — Patient Instructions (Addendum)
A few things to remember from today's visit:   No changes today. Contact your rheumatologist to ask if you have to stop Orencia before getting vaccine for COVID 19.  If you need refills please call your pharmacy. Do not use My Chart to request refills or for acute issues that need immediate attention.    Please be sure medication list is accurate. If a new problem present, please set up appointment sooner than planned today.

## 2020-05-09 NOTE — Progress Notes (Signed)
HPI:  Tracey Wood is a 54 y.o. female, who is here today for 2 months follow up.   She was last seen on 03/12/2020. No new problems since her last visit. She is currently on amlodipine-benazepril 5-12 mg daily for hypertension. BP readings at home: 104/73, 118/77, 107/82, 114/79.  Negative for severe/frequent headache, chest pain, dyspnea, palpitation, claudication, focal weakness, or edema.  She has noted visual changes since episode of syncope, 3-20 21. Negative for diplopia or eye pain. Retro-ocular pain resolved after BP was better controlled.   Lab Results  Component Value Date   CREATININE 0.65 03/12/2020   BUN 11 03/12/2020   NA 135 03/12/2020   K 4.3 03/12/2020   CL 98 03/12/2020   CO2 30 03/12/2020   -Since her last visit she has followed with neuro. She has a f/u appt in 06/2020. She has not had another syncopal or altered MS event. She is now driving. Brain MRI done on 04/11/20 was normal..  She needs another form to be completed, for the time she was out of work: 02/26/20 to 03/10/20.  -She is concerned about result of recent COVID-19 antibodies test after being positive for COVID 19 x 2.  COVID 19 Ab NR. Her husband was sick with COVID-19 infection, so she had to be tested. She was asymptomatic.  RA, she follows with rheumatologist. She is on abatacept and meloxicam.  Review of Systems  Constitutional: Negative for activity change, appetite change, fatigue and fever.  HENT: Negative for mouth sores, nosebleeds and sore throat.   Respiratory: Negative for cough and wheezing.   Gastrointestinal: Negative for abdominal pain, nausea and vomiting.       Negative for changes in bowel habits.  Genitourinary: Negative for decreased urine volume, dysuria and hematuria.  Musculoskeletal: Negative for gait problem and myalgias.  Neurological: Negative for facial asymmetry, speech difficulty and weakness.  Psychiatric/Behavioral: Negative for  confusion. The patient is not nervous/anxious.   Rest of ROS, see pertinent positives sand negatives in HPI  Current Outpatient Medications on File Prior to Visit  Medication Sig Dispense Refill  . Abatacept (ORENCIA [place]) Inject into the skin.    . cyclobenzaprine (FLEXERIL) 10 MG tablet Take 10 mg by mouth at bedtime.    . meloxicam (MOBIC) 15 MG tablet Take 15 mg by mouth daily.    . norethindrone (MICRONOR) 0.35 MG tablet Take 1 tablet (0.35 mg total) by mouth daily. 3 Package 1  . Vitamin D, Ergocalciferol, (DRISDOL) 1.25 MG (50000 UT) CAPS capsule Take 50,000 Units by mouth every 7 (seven) days.     No current facility-administered medications on file prior to visit.    Past Medical History:  Diagnosis Date  . Bronchitis   . Cataract    had sx   . Hypertension   . Rheumatoid arthritis ([place])   . Thyroid disease   . UTI (urinary tract infection)    Allergies  Allergen Reactions  . Morphine And Related Other (See Comments)    Fast heart beat  . Ciprofloxacin Rash  . Oxycodone Palpitations  . Tape Dermatitis    Social History   Socioeconomic History  . Marital status: Married    Spouse name: Not on file  . Number of children: Not on file  . Years of education: Not on file  . Highest education level: Not on file  Occupational History  . Not on file  Tobacco Use  . Smoking status: Never Smoker  .  Smokeless tobacco: Never Used  Substance and Sexual Activity  . Alcohol use: No    Alcohol/week: 0.0 standard drinks  . Drug use: No  . Sexual activity: Yes    Partners: Male    Birth control/protection: Other-see comments    Comment: 1st intercourse- 45, partners- 2, married- 55 yrs   Other Topics Concern  . Not on file  Social History Narrative   Right handed   One story   No caffeine x 2 weeks   Social Determinants of Health   Financial Resource Strain:   . Difficulty of Paying Living Expenses:   Food Insecurity:   . Worried About Charity fundraiser in the  Last Year:   . Arboriculturist in the Last Year:   Transportation Needs:   . Film/video editor (Medical):   Marland Kitchen Lack of Transportation (Non-Medical):   Physical Activity:   . Days of Exercise per Week:   . Minutes of Exercise per Session:   Stress:   . Feeling of Stress :   Social Connections:   . Frequency of Communication with Friends and Family:   . Frequency of Social Gatherings with Friends and Family:   . Attends Religious Services:   . Active Member of Clubs or Organizations:   . Attends Archivist Meetings:   Marland Kitchen Marital Status:     Vitals:   05/09/20 0705  BP: 120/64  Pulse: 73  Resp: 12  Temp: 97.8 F (36.6 C)  SpO2: 98%   Body mass index is 27.25 kg/m.  Physical Exam  Nursing note and vitals reviewed. Constitutional: She is oriented to person, place, and time. She appears well-developed. No distress.  HENT:  Head: Normocephalic and atraumatic.  Mouth/Throat: Oropharynx is clear and moist and mucous membranes are normal.  Eyes: Pupils are equal, round, and reactive to light. Conjunctivae are normal.  Cardiovascular: Normal rate and regular rhythm.  No murmur heard. Pulses:      Dorsalis pedis pulses are 2+ on the right side and 2+ on the left side.  Respiratory: Effort normal and breath sounds normal. No respiratory distress.  GI: Soft. She exhibits no mass. There is no hepatomegaly. There is no abdominal tenderness.  Musculoskeletal:        General: No edema.  Lymphadenopathy:    She has no cervical adenopathy.  Neurological: She is alert and oriented to person, place, and time. She has normal strength. No cranial nerve deficit. Gait normal.  Skin: Skin is warm. No rash noted. No erythema.  Psychiatric: She has a normal mood and affect.  Well groomed, good eye contact.   ASSESSMENT AND PLAN:  Tracey Wood was seen today for 2 months follow-up.   Hypertension, essential, benign Adequately controlled. No changes in current  management. DASH diet recommended. Eye exam recommended annually. F/U in 6 months, before if needed.   Visual changes Instructed to arrange appt with eye care provider. Instructed about warning signs.  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 Because Abatacep modulates T cell activation,altering immune response ,she may need to d/c med for a few weeks before vaccination.Recommend asking her rheumatologist.  Altered awareness, transient She has not had another episode and normal brain MRI. She is now driving, advised caution. We discussed  recommendations in regard to driving. Keep appt with neuro.  Return in about 6 months (around 11/09/2020).   Kenzie Thoreson G. Martinique, MD  [facility]. [place] office.

## 2020-05-09 NOTE — Telephone Encounter (Signed)
Pt dropped off a disability form to the office this morning, Dr Martinique has completed the form and pt is aware to pick up form from the front office. Pt form has been placed at the cabinet at the front office

## 2020-05-11 ENCOUNTER — Encounter: Payer: Self-pay | Admitting: Family Medicine

## 2020-05-14 ENCOUNTER — Encounter: Payer: Self-pay | Admitting: Family Medicine

## 2020-05-14 ENCOUNTER — Ambulatory Visit: Payer: 59 | Admitting: Family Medicine

## 2020-05-14 ENCOUNTER — Other Ambulatory Visit: Payer: Self-pay

## 2020-05-14 VITALS — BP 98/60 | HR 76 | Temp 97.2°F | Resp 12 | Ht 62.0 in | Wt 148.5 lb

## 2020-05-14 DIAGNOSIS — R1011 Right upper quadrant pain: Secondary | ICD-10-CM | POA: Diagnosis not present

## 2020-05-14 DIAGNOSIS — K760 Fatty (change of) liver, not elsewhere classified: Secondary | ICD-10-CM

## 2020-05-14 NOTE — Telephone Encounter (Signed)
Patient picked up form on 05/09/2020.

## 2020-05-14 NOTE — Progress Notes (Signed)
ACUTE VISIT  Chief Complaint  Patient presents with  . Pain under right rib   HPI: [name] is a 54 y.o. female, who is here today complaining of RUQ abdominal pain radiated to right mid back. This is a new problem that it started about 2 weeks ago. Pain is dull, intermittent, 7/10. Gradual onset, it may last hours.  She has not identified exacerbating or alleviating factors. Negative for associated CP, cough, wheezing, heartburn, N/V, changes in bowel habits, urinary symptoms, or a skin rash. Problem seems to be getting worse. Denies fever, chills, or change in appetite.  Tylenol 1000 mg seems to help.  He had an abdominal CT done in 09/2019 because of acute generalized abdominal pain.  It showed marked hepatic asteatosis, she wonders if this problem is causing pain.  Lab Results  Component Value Date   ALT 25 10/08/2019   AST 21 10/08/2019   ALKPHOS 71 10/08/2019   BILITOT 0.8 10/08/2019   Review of Systems  Constitutional: Positive for fatigue.  HENT: Negative for sore throat and trouble swallowing.   Respiratory: Negative for shortness of breath.   Cardiovascular: Negative for palpitations and leg swelling.  Genitourinary: Negative for decreased urine volume, dysuria, hematuria and pelvic pain.  Musculoskeletal: Negative for gait problem and joint swelling.  Neurological: Negative for syncope and weakness.  Rest see pertinent positives and negatives per HPI.  Current Outpatient Medications on File Prior to Visit  Medication Sig Dispense Refill  . amLODipine-benazepril (LOTREL) 5-20 MG capsule Take 1 capsule by mouth daily. 90 capsule 2  . meloxicam (MOBIC) 15 MG tablet Take 15 mg by mouth daily.    Marland Kitchen ORENCIA CLICKJECT 0000000 MG/ML SOAJ Inject 1 Syringe into the skin once a week.    . Vitamin D, Ergocalciferol, (DRISDOL) 1.25 MG (50000 UT) CAPS capsule Take 50,000 Units by mouth every 7 (seven) days.     No current facility-administered  medications on file prior to visit.   Past Medical History:  Diagnosis Date  . Bronchitis   . Cataract    had sx   . Hypertension   . Rheumatoid arthritis ([place])   . Thyroid disease   . UTI (urinary tract infection)    Allergies  Allergen Reactions  . Morphine And Related Other (See Comments)    Fast heart beat  . Ciprofloxacin Rash  . Oxycodone Palpitations  . Tape Dermatitis    Social History   Socioeconomic History  . Marital status: Married    Spouse name: Not on file  . Number of children: Not on file  . Years of education: Not on file  . Highest education level: Not on file  Occupational History  . Not on file  Tobacco Use  . Smoking status: Never Smoker  . Smokeless tobacco: Never Used  Substance and Sexual Activity  . Alcohol use: No    Alcohol/week: 0.0 standard drinks  . Drug use: No  . Sexual activity: Yes    Partners: Male    Birth control/protection: Other-see comments    Comment: 1st intercourse- 71, partners- 2, married- 64 yrs   Other Topics Concern  . Not on file  Social History Narrative   Right handed   One story   No caffeine x 2 weeks   Social Determinants of Health   Financial Resource Strain:   . Difficulty of Paying Living Expenses:   Food Insecurity:   . Worried About Charity fundraiser in the Last  Year:   . Ran Out of Food in the Last Year:   Transportation Needs:   . Film/video editor (Medical):   Marland Kitchen Lack of Transportation (Non-Medical):   Physical Activity:   . Days of Exercise per Week:   . Minutes of Exercise per Session:   Stress:   . Feeling of Stress :   Social Connections:   . Frequency of Communication with Friends and Family:   . Frequency of Social Gatherings with Friends and Family:   . Attends Religious Services:   . Active Member of Clubs or Organizations:   . Attends Archivist Meetings:   Marland Kitchen Marital Status:    Vitals:   05/14/20 0706  BP: 98/60  Pulse: 76  Resp: 12  Temp: (!) 97.2 F  (36.2 C)  SpO2: 99%   Body mass index is 27.16 kg/m.  Physical Exam  Nursing note and vitals reviewed. Constitutional: She is oriented to person, place, and time. She appears well-developed. No distress.  HENT:  Head: Normocephalic and atraumatic.  Eyes: Pupils are equal, round, and reactive to light. Conjunctivae are normal.  Cardiovascular: Normal rate and regular rhythm.  No murmur heard. Respiratory: Effort normal and breath sounds normal. No respiratory distress.  GI: Soft. She exhibits no mass. There is no hepatomegaly. There is abdominal tenderness in the right upper quadrant and epigastric area. There is no rigidity, no rebound and no guarding.  Musculoskeletal:        General: No edema.     Lumbar back: No tenderness or bony tenderness.  Lymphadenopathy:    She has no cervical adenopathy.  Neurological: She is alert and oriented to person, place, and time. She has normal strength. No cranial nerve deficit. Gait normal.  Skin: Skin is warm. No rash noted. No erythema.  Psychiatric: She has a normal mood and affect.  Well groomed, good eye contact.   ASSESSMENT AND PLAN:  Tracey Wood was seen today for pain under right rib.  Diagnoses and all orders for this visit:  RUQ abdominal pain We discussed possible etiologies. ?  Cholelithiasis. I do not think we need to do blood work today. Imaging will be arranged. Tylenol seems to be helping, so continue 1 to 2 tablets up to 3 times per day as needed. Instructed about warning signs.  -     US Abdomen Limited RUQ; Future  Hepatic steatosis I do not think this is causing abdominal pain. Continue low-fat diet. Further recommendations according to RUQ [place].   Return if symptoms worsen or fail to improve, for Keep next f/u visit.Marland Kitchen   Keiyon Plack G. Martinique, MD  [facility]. [place] office.  Discharge Instructions   None     A few things to remember from today's visit:    Colelitiasis Cholelithiasis  La  colelitiasis tambin recibe el nombre de "clculos biliares". Es un tipo de enfermedad de la vescula biliar. La vescula biliar es un rgano que almacena un lquido (bilis) que ayuda a Publishing copy las [place]. Los clculos biliares podran no causar sntomas (clculos biliares silenciosos) hasta provocar una obstruccin; luego, pueden causar dolor (ataque de la vescula biliar). Siga estas indicaciones en su casa:  Tome los medicamentos de venta libre y los recetados solamente como se lo haya indicado el mdico.  Mantenga un peso saludable.  Consuma alimentos saludables. Esto incluye lo siguiente: ? Comer una menor cantidad de alimentos grasos, como los alimentos fritos. ? Comer una menor cantidad de carbohidratos refinados. Los carbohidratos refinados son  los panes y los cereales muy procesados, como el pan blanco y el arroz blanco. En cambio, elegir cereales integrales, como el pan integral o el arroz integral. ? Consumir ms fibra. [place], las frutas frescas y los frijoles son fuentes saludables de [place].  Concurra a todas las visitas de seguimiento como se lo haya indicado el mdico. Esto es importante. Comunquese con un mdico si:  De repente, siente dolor en el costado superior derecho del vientre (abdomen). El dolor podra extenderse hasta el hombro derecho o el pecho. Estos pueden ser sntomas de un ataque de la vescula biliar.  Siente Higher education careers adviser (tiene nuseas).  Devuelve (vomita).  Le han diagnosticado clculos biliares que no presentan sntomas y tiene lo siguiente: ? Dolor abdominal. ? Molestias, ardor o sensacin de plenitud en la parte superior del vientre (empacho). Solicite ayuda de inmediato si:  De repente, siente dolor en el costado superior derecho del vientre que dura ms de 2horas.  Tiene dolor abdominal que dura ms de 5horas.  Tiene fiebre o siente escalofros.  Sigue sintiendo nuseas o vomitando.  Nota que la piel o la parte blanca del ojo  estn amarillas (ictericia).  Hace pis (orina) de color oscuro.  Su materia fecal (heces) es de tono muy claro. Resumen  La colelitiasis tambin recibe el nombre de "clculos biliares".  La vescula biliar es un rgano que almacena un lquido (bilis) que ayuda a Publishing copy las [place].  Los clculos biliares silenciosos son clculos biliares que no causan sntomas.  Un ataque de la vescula biliar podra causar un dolor repentino en el costado superior derecho del vientre. El dolor podra extenderse hasta el hombro derecho o el pecho. Si esto ocurre, comunquese con el mdico.  Si le aparece un dolor repentino en el costado superior derecho del vientre que dura ms de 2horas, busque ayuda de inmediato. Esta informacin no tiene Marine scientist el consejo del mdico. Asegrese de hacerle al mdico cualquier pregunta que tenga. Document Revised: 06/13/2017 Document Reviewed: 06/06/2013 Elsevier Patient Education  [employer].    If you need refills please call your pharmacy. Do not use My Chart to request refills or for acute issues that need immediate attention.    Please be sure medication list is accurate. If a new problem present, please set up appointment sooner than planned today.

## 2020-05-14 NOTE — Patient Instructions (Addendum)
A few things to remember from today's visit:    Colelitiasis Cholelithiasis  La colelitiasis tambin recibe el nombre de "clculos biliares". Es un tipo de enfermedad de la vescula biliar. La vescula biliar es un rgano que almacena un lquido (bilis) que ayuda a Publishing copy las [place]. Los clculos biliares podran no causar sntomas (clculos biliares silenciosos) hasta provocar una obstruccin; luego, pueden causar dolor (ataque de la vescula biliar). Siga estas indicaciones en su casa:  Tome los medicamentos de venta libre y los recetados solamente como se lo haya indicado el mdico.  Mantenga un peso saludable.  Consuma alimentos saludables. Esto incluye lo siguiente: ? Comer una menor cantidad de alimentos grasos, como los alimentos fritos. ? Comer una menor cantidad de carbohidratos refinados. Los carbohidratos refinados son los panes y los cereales muy procesados, como el pan blanco y el arroz blanco. En cambio, elegir cereales integrales, como el pan integral o el arroz integral. ? Consumir ms fibra. [place], las frutas frescas y los frijoles son fuentes saludables de [place].  Concurra a todas las visitas de seguimiento como se lo haya indicado el mdico. Esto es importante. Comunquese con un mdico si:  De repente, siente dolor en el costado superior derecho del vientre (abdomen). El dolor podra extenderse hasta el hombro derecho o el pecho. Estos pueden ser sntomas de un ataque de la vescula biliar.  Siente Higher education careers adviser (tiene nuseas).  Devuelve (vomita).  Le han diagnosticado clculos biliares que no presentan sntomas y tiene lo siguiente: ? Dolor abdominal. ? Molestias, ardor o sensacin de plenitud en la parte superior del vientre (empacho). Solicite ayuda de inmediato si:  De repente, siente dolor en el costado superior derecho del vientre que dura ms de 2horas.  Tiene dolor abdominal que dura ms de 5horas.  Tiene fiebre o siente  escalofros.  Sigue sintiendo nuseas o vomitando.  Nota que la piel o la parte blanca del ojo estn amarillas (ictericia).  Hace pis (orina) de color oscuro.  Su materia fecal (heces) es de tono muy claro. Resumen  La colelitiasis tambin recibe el nombre de "clculos biliares".  La vescula biliar es un rgano que almacena un lquido (bilis) que ayuda a Publishing copy las [place].  Los clculos biliares silenciosos son clculos biliares que no causan sntomas.  Un ataque de la vescula biliar podra causar un dolor repentino en el costado superior derecho del vientre. El dolor podra extenderse hasta el hombro derecho o el pecho. Si esto ocurre, comunquese con el mdico.  Si le aparece un dolor repentino en el costado superior derecho del vientre que dura ms de 2horas, busque ayuda de inmediato. Esta informacin no tiene Marine scientist el consejo del mdico. Asegrese de hacerle al mdico cualquier pregunta que tenga. Document Revised: 06/13/2017 Document Reviewed: 06/06/2013 Elsevier Patient Education  [employer].    If you need refills please call your pharmacy. Do not use My Chart to request refills or for acute issues that need immediate attention.    Please be sure medication list is accurate. If a new problem present, please set up appointment sooner than planned today.

## 2020-05-19 ENCOUNTER — Ambulatory Visit
Admission: RE | Admit: 2020-05-19 | Discharge: 2020-05-19 | Disposition: A | Discharge status: Home / Self Care | Payer: 59 | Source: Ambulatory Visit | Attending: Family Medicine | Admitting: Family Medicine

## 2020-05-19 DIAGNOSIS — R1011 Right upper quadrant pain: Secondary | ICD-10-CM

## 2020-05-21 ENCOUNTER — Encounter: Payer: Self-pay | Admitting: Family Medicine

## 2020-05-26 ENCOUNTER — Encounter: Payer: Self-pay | Admitting: Family Medicine

## 2020-06-09 ENCOUNTER — Encounter: Payer: Self-pay | Admitting: Family Medicine

## 2020-06-09 ENCOUNTER — Ambulatory Visit: Payer: 59 | Admitting: Family Medicine

## 2020-06-09 ENCOUNTER — Other Ambulatory Visit: Payer: Self-pay

## 2020-06-09 VITALS — BP 110/70 | HR 89 | Temp 97.7°F | Resp 12 | Ht 62.0 in | Wt 146.1 lb

## 2020-06-09 DIAGNOSIS — R110 Nausea: Secondary | ICD-10-CM | POA: Diagnosis not present

## 2020-06-09 DIAGNOSIS — R1011 Right upper quadrant pain: Secondary | ICD-10-CM | POA: Diagnosis not present

## 2020-06-09 MED ORDER — ONDANSETRON HCL 4 MG PO TABS: 4.0000 mg | ORAL_TABLET | Freq: Three times a day (TID) | ORAL | 0 refills | Status: AC | PRN

## 2020-06-09 NOTE — Patient Instructions (Addendum)
A few things to remember from today's visit:   Nausea without vomiting - Plan: Ambulatory referral to Gastroenterology  Right upper quadrant pain - Plan: NM Hepato W/Eject Fract, Ambulatory referral to Gastroenterology   Zofran for nausea. Continue avoiding fat food.  If you need refills please call your pharmacy. Do not use My Chart to request refills or for acute issues that need immediate attention.    Please be sure medication list is accurate. If a new problem present, please set up appointment sooner than planned today.

## 2020-06-09 NOTE — Progress Notes (Signed)
Chief Complaint  Patient presents with  . Pain under right rib cage    started 2 weeks ago with nausea    HPI:  Tracey Wood is a 54 y.o. female, who is here today for follow up.  She was last seen on 05/14/20. She has had RUQ and right-sided back pain that started early 04/2020.  Acute care visit on 05/16/20 because pain. Prescribed Tramadol,which she is not longer taking.  She decreased fat intake, stopped milk. She noted some improvement in RUQ pain. She is not longer having back pain. She has noted epigastric abdominal pain in the morning and alleviated after eating breakfast.  No fever,chills,sore throat,dysphagia,heartburn,cough,changes in bowel movements,or melena. She has lost wt,eating less.  Intense nausea in the morning and at night mainly, intermittent for a couple weeks. She has not identified exacerbating or alleviating factors.  RUQ [place] 04/19/20:Diffuse increase in liver echogenicity, a finding indicative of hepatic steatosis. No focal liver lesions are evident; it must be cautioned that the sensitivity of ultrasound for detection of focal liver lesions is diminished in this circumstance. Study otherwise unremarkable.  Abdominal/pelvic CT on 10/08/19: 1. Marked hepatic steatosis. 2. Indistinctness and hyperenhancement of the bilateral ureters without evidence of obstructing calculus. This may represent infectious/inflammatory changes.   Review of Systems  Constitutional: Negative for activity change, appetite change and fatigue.  HENT: Negative for mouth sores and nosebleeds.   Respiratory: Negative for cough, shortness of breath and wheezing.   Cardiovascular: Negative for palpitations and leg swelling.  Genitourinary: Negative for decreased urine volume, dysuria and hematuria.  Musculoskeletal: Negative for gait problem.  Neurological: Negative for syncope, weakness and headaches.  Rest of ROS, see pertinent positives sand negatives in  HPI  Current Outpatient Medications on File Prior to Visit  Medication Sig Dispense Refill  . amLODipine-benazepril (LOTREL) 5-20 MG capsule Take 1 capsule by mouth daily. 90 capsule 2  . meloxicam (MOBIC) 15 MG tablet Take 15 mg by mouth daily.    Marland Kitchen ORENCIA CLICKJECT 329 MG/ML SOAJ Inject 1 Syringe into the skin once a week.    . Vitamin D, Ergocalciferol, (DRISDOL) 1.25 MG (50000 UT) CAPS capsule Take 50,000 Units by mouth every 7 (seven) days.     No current facility-administered medications on file prior to visit.     Past Medical History:  Diagnosis Date  . Bronchitis   . Cataract    had sx   . Hypertension   . Rheumatoid arthritis ([place])   . Thyroid disease   . UTI (urinary tract infection)    Allergies  Allergen Reactions  . Morphine And Related Other (See Comments)    Fast heart beat  . Ciprofloxacin Rash  . Oxycodone Palpitations  . Tape Dermatitis    Social History   Socioeconomic History  . Marital status: Married    Spouse name: Not on file  . Number of children: Not on file  . Years of education: Not on file  . Highest education level: Not on file  Occupational History  . Not on file  Tobacco Use  . Smoking status: Never Smoker  . Smokeless tobacco: Never Used  Vaping Use  . Vaping Use: Never used  Substance and Sexual Activity  . Alcohol use: No    Alcohol/week: 0.0 standard drinks  . Drug use: No  . Sexual activity: Yes    Partners: Male    Birth control/protection: Other-see comments    Comment: 1st intercourse- 71, partners- 2, married-  12 yrs   Other Topics Concern  . Not on file  Social History Narrative   Right handed   One story   No caffeine x 2 weeks   Social Determinants of Health   Financial Resource Strain:   . Difficulty of Paying Living Expenses:   Food Insecurity:   . Worried About Charity fundraiser in the Last Year:   . Arboriculturist in the Last Year:   Transportation Needs:   . Film/video editor (Medical):    Marland Kitchen Lack of Transportation (Non-Medical):   Physical Activity:   . Days of Exercise per Week:   . Minutes of Exercise per Session:   Stress:   . Feeling of Stress :   Social Connections:   . Frequency of Communication with Friends and Family:   . Frequency of Social Gatherings with Friends and Family:   . Attends Religious Services:   . Active Member of Clubs or Organizations:   . Attends Archivist Meetings:   Marland Kitchen Marital Status:     Vitals:   06/09/20 1423  BP: 110/70  Pulse: 89  Resp: 12  Temp: 97.7 F (36.5 C)  SpO2: 98%   Wt Readings from Last 3 Encounters:  06/09/20 146 lb 2 oz (66.3 kg)  05/14/20 148 lb 8 oz (67.4 kg)  05/09/20 149 lb (67.6 kg)   Body mass index is 26.73 kg/m.  Physical Exam  Nursing note and vitals reviewed. Constitutional: She is oriented to person, place, and time. She appears well-developed. She does not appear ill. No distress.  HENT:  Head: Atraumatic.  Mouth/Throat: Mucous membranes are moist.  Eyes: Conjunctivae are normal. No scleral icterus.  Cardiovascular: Normal rate and regular rhythm.  No murmur heard. Respiratory: Effort normal and breath sounds normal. No respiratory distress.  GI: Soft. Normal appearance and bowel sounds are normal. She exhibits no distension and no mass. There is no hepatomegaly. There is abdominal tenderness in the epigastric area. There is no rebound and no guarding.  Lymphadenopathy:    She has no cervical adenopathy.       Right: No supraclavicular adenopathy present.       Left: No supraclavicular adenopathy present.  Neurological: She is alert and oriented to person, place, and time. She displays no weakness. Gait normal.  Skin: Skin is warm. No rash noted. No erythema.  Psychiatric:  Well groomed, good eye contact.    ASSESSMENT AND PLAN:  [name] was seen today for follow-up.  Orders Placed This Encounter  Procedures  . NM Hepato W/Eject Fract  . Ambulatory  referral to Gastroenterology    1. Right upper quadrant pain And now epigastric pain. ? Dyspepsia, gall bladder dyskinesia among some to consider. Instructed about warning signs. HIDA scan will be arranged. Continue avoiding foods that could aggravate problem.  2. Nausea without vomiting Symptomatic treatment with Zofran 4 mg tid as needed. Adequate hydration. Bland and small meals. GI referral placed.   Return if symptoms worsen or fail to improve.   Meryl Ponder G. Martinique, MD  [facility]. [place] office.

## 2020-06-12 ENCOUNTER — Encounter: Payer: Self-pay | Admitting: Family Medicine

## 2020-07-02 ENCOUNTER — Encounter: Payer: Managed Care, Other (non HMO) | Admitting: Obstetrics & Gynecology
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
## 2020-07-09 ENCOUNTER — Other Ambulatory Visit: Payer: Self-pay

## 2020-07-09 ENCOUNTER — Encounter: Payer: Self-pay | Admitting: Neurology

## 2020-07-09 ENCOUNTER — Telehealth (INDEPENDENT_AMBULATORY_CARE_PROVIDER_SITE_OTHER): Payer: 59 | Admitting: Neurology

## 2020-07-09 DIAGNOSIS — I10 Essential (primary) hypertension: Secondary | ICD-10-CM | POA: Diagnosis not present

## 2020-07-09 DIAGNOSIS — R404 Transient alteration of awareness: Secondary | ICD-10-CM

## 2020-07-14 ENCOUNTER — Telehealth: Payer: 59 | Admitting: Family Medicine

## 2020-08-06 ENCOUNTER — Ambulatory Visit: Payer: 59 | Admitting: Physician Assistant

## 2020-08-07 ENCOUNTER — Ambulatory Visit: Payer: 59 | Admitting: Obstetrics & Gynecology

## 2020-08-07 ENCOUNTER — Encounter: Payer: Self-pay | Admitting: Obstetrics & Gynecology

## 2020-08-07 ENCOUNTER — Other Ambulatory Visit: Payer: Self-pay

## 2020-08-07 VITALS — BP 132/78 | Ht 62.75 in | Wt 149.6 lb

## 2020-08-07 DIAGNOSIS — N8501 Benign endometrial hyperplasia: Secondary | ICD-10-CM

## 2020-08-07 DIAGNOSIS — Z9851 Tubal ligation status: Secondary | ICD-10-CM | POA: Diagnosis not present

## 2020-08-07 DIAGNOSIS — Z01419 Encounter for gynecological examination (general) (routine) without abnormal findings: Secondary | ICD-10-CM

## 2020-08-07 MED ORDER — NORETHINDRONE 0.35 MG PO TABS: 1.0000 | ORAL_TABLET | Freq: Every day | ORAL | 4 refills | Status: DC

## 2020-08-07 NOTE — Progress Notes (Signed)
Tracey Wood December 31, 1965 [id number]   History:    54 y.o. G0 Married.  Adopted daughter has 3 children.  RP:  Established patient presenting for annual gyn exam and repeat EBx  HPI:  Very light menses on the Progestin-only BCPs.  H/O Simple Endometrial Hyperplasia without Atypia.  No pelvic pain.  No pain with IC.  Urine/BMs normal.  Breasts normal.  BMI 26.71.  Followed by Rheumato Dr Marella Chimes for Arthritis.  Health labs with Fam MD.  Past medical history,surgical history, family history and social history were all reviewed and documented in the EPIC chart.  Gynecologic History Patient's last menstrual period was 06/07/2020.  Obstetric History OB History  Gravida Para Term Preterm AB Living  3 2 0 1   0  SAB TAB Ectopic Multiple Live Births  0 0 0 0      # Outcome Date GA Lbr Len/2nd Weight Sex Delivery Anes PTL Lv  3 Gravida           2 Para           1 Preterm              ROS: A ROS was performed and pertinent positives and negatives are included in the history.  GENERAL: No fevers or chills. HEENT: No change in vision, no earache, sore throat or sinus congestion. NECK: No pain or stiffness. CARDIOVASCULAR: No chest pain or pressure. No palpitations. PULMONARY: No shortness of breath, cough or wheeze. GASTROINTESTINAL: No abdominal pain, nausea, vomiting or diarrhea, melena or bright red blood per rectum. GENITOURINARY: No urinary frequency, urgency, hesitancy or dysuria. MUSCULOSKELETAL: No joint or muscle pain, no back pain, no recent trauma. DERMATOLOGIC: No rash, no itching, no lesions. ENDOCRINE: No polyuria, polydipsia, no heat or cold intolerance. No recent change in weight. HEMATOLOGICAL: No anemia or easy bruising or bleeding. NEUROLOGIC: No headache, seizures, numbness, tingling or weakness. PSYCHIATRIC: No depression, no loss of interest in normal activity or change in sleep pattern.     Exam:   BP 132/78   Ht 5' 2.75" (1.594 m)   Wt 149 lb 9.6 oz  (67.9 kg)   LMP 06/07/2020   BMI 26.71 kg/m   Body mass index is 26.71 kg/m.  General appearance : Well developed well nourished female. No acute distress HEENT: Eyes: no retinal hemorrhage or exudates,  Neck supple, trachea midline, no carotid bruits, no thyroidmegaly Lungs: Clear to auscultation, no rhonchi or wheezes, or rib retractions  Heart: Regular rate and rhythm, no murmurs or gallops Breast:Examined in sitting and supine position were symmetrical in appearance, no palpable masses or tenderness,  no skin retraction, no nipple inversion, no nipple discharge, no skin discoloration, no axillary or supraclavicular lymphadenopathy Abdomen: no palpable masses or tenderness, no rebound or guarding Extremities: no edema or skin discoloration or tenderness  Pelvic: Vulva: Normal             Vagina: No gross lesions or discharge  Cervix: No gross lesions or discharge.  Pap reflex done.  Endometrial Bx:  Verbal informed consent obtained.  Betadine prep/Hurricane spray.  Tenaculum on anterior lip of cervix.  Easy Endometrial Bx with canula.  Moderate specimen sent to pathology. All instruments removed.  Good hemostasis. No Cx.  Uterus  AV, normal size, shape and consistency, non-tender and mobile  Adnexa  Without masses or tenderness  Anus: Normal   Assessment/Plan:  54 y.o. female for annual exam   1. Encounter for routine gynecological examination  with Papanicolaou smear of cervix Normal gynecologic exam.  Pap reflex done.  Breast exam normal.  Screening mammogram November 2020 was negative.  Colonoscopy 2021.  Health labs with family physician.  Good body mass index of 26.71.  Continue with fitness and healthy nutrition. - Pap IG w/ reflex to HPV when ASC-U  2. Tubal ligation status  3. Simple endometrial hyperplasia without atypia Will continue on the progestin only birth control pill to counteract the estrogen stimulation on the endometrium.  Endometrial biopsy done today.   Well-tolerated and no complication.  Pathology pending.  Management per results. - Pathology Report ([facility])  Other orders - norethindrone (MICRONOR) 0.35 MG tablet; Take 1 tablet (0.35 mg total) by mouth daily.  [name] MD, 4:09 PM 08/07/2020

## 2020-08-09 ENCOUNTER — Encounter: Payer: Self-pay | Admitting: Obstetrics & Gynecology

## 2020-08-11 LAB — TISSUE SPECIMEN

## 2020-08-11 LAB — PAP IG W/ RFLX HPV ASCU

## 2020-08-11 LAB — PATHOLOGY REPORT

## 2020-08-12 ENCOUNTER — Other Ambulatory Visit: Payer: 59

## 2020-08-12 ENCOUNTER — Other Ambulatory Visit: Payer: Self-pay | Admitting: Family Medicine

## 2020-08-12 ENCOUNTER — Telehealth: Payer: Self-pay

## 2020-08-12 ENCOUNTER — Other Ambulatory Visit: Payer: Self-pay

## 2020-08-12 DIAGNOSIS — R1013 Epigastric pain: Secondary | ICD-10-CM

## 2020-08-12 NOTE — Telephone Encounter (Signed)
Appointment has been made for today at 4pm.

## 2020-08-12 NOTE — Telephone Encounter (Signed)
-----   Message from Betty G Martinique, MD sent at 08/12/2020  7:02 AM EDT ----- Regarding: Lab I added lab order for H. Pylori, she needs lab appt. Pending EGD. Thanks, BJ

## 2020-08-12 NOTE — Telephone Encounter (Deleted)
-----   Message from Betty G Martinique, MD sent at 08/12/2020  7:02 AM EDT ----- Regarding: Lab I added lab order for H. Pylori, she needs lab appt. Pending EGD. Thanks, BJ

## 2020-08-12 NOTE — Telephone Encounter (Signed)
I left patient's husband a voicemail to return a call to the office to schedule a lab appointment.

## 2020-08-13 ENCOUNTER — Other Ambulatory Visit: Payer: 59

## 2020-08-13 DIAGNOSIS — R1013 Epigastric pain: Secondary | ICD-10-CM

## 2020-08-14 LAB — H. PYLORI BREATH TEST: H. pylori Breath Test: DETECTED — AB

## 2020-08-15 ENCOUNTER — Telehealth: Payer: Self-pay | Admitting: Family Medicine

## 2020-08-15 NOTE — Telephone Encounter (Signed)
Pts spouse is calling in to get the lab results from a procedure that the pt had done 2 days ago 08/13/2020 and is aware that when the provider has resulted the labs that someone will give her a call.

## 2020-08-17 ENCOUNTER — Other Ambulatory Visit: Payer: Self-pay | Admitting: Family Medicine

## 2020-08-17 DIAGNOSIS — A048 Other specified bacterial intestinal infections: Secondary | ICD-10-CM

## 2020-08-17 MED ORDER — PYLERA 140-125-125 MG PO CAPS: 3.0000 | ORAL_CAPSULE | Freq: Three times a day (TID) | ORAL | 0 refills | Status: DC

## 2020-08-17 MED ORDER — OMEPRAZOLE 20 MG PO CPDR: 20.0000 mg | DELAYED_RELEASE_CAPSULE | Freq: Two times a day (BID) | ORAL | 0 refills | Status: DC

## 2020-08-18 NOTE — Telephone Encounter (Signed)
See result note.  

## 2020-11-05 ENCOUNTER — Ambulatory Visit: Payer: 59 | Admitting: Family Medicine

## 2020-11-05 ENCOUNTER — Other Ambulatory Visit: Payer: Self-pay

## 2020-11-05 ENCOUNTER — Encounter: Payer: Self-pay | Admitting: Family Medicine

## 2020-11-05 VITALS — BP 124/76 | HR 88 | Temp 98.7°F | Resp 16 | Ht 62.0 in | Wt 156.8 lb

## 2020-11-05 DIAGNOSIS — K219 Gastro-esophageal reflux disease without esophagitis: Secondary | ICD-10-CM | POA: Insufficient documentation

## 2020-11-05 DIAGNOSIS — E559 Vitamin D deficiency, unspecified: Secondary | ICD-10-CM

## 2020-11-05 DIAGNOSIS — I10 Essential (primary) hypertension: Secondary | ICD-10-CM

## 2020-11-05 DIAGNOSIS — M0579 Rheumatoid arthritis with rheumatoid factor of multiple sites without organ or systems involvement: Secondary | ICD-10-CM | POA: Diagnosis not present

## 2020-11-05 DIAGNOSIS — E663 Overweight: Secondary | ICD-10-CM

## 2020-11-05 MED ORDER — OMEPRAZOLE 20 MG PO CPDR: 20.0000 mg | DELAYED_RELEASE_CAPSULE | Freq: Two times a day (BID) | ORAL | 2 refills | Status: DC

## 2020-11-05 NOTE — Assessment & Plan Note (Signed)
Complete Ergocalciferol 50,000 U weekly then she can continue Vit D3 2000 U daily.

## 2020-11-05 NOTE — Assessment & Plan Note (Signed)
BP is adequately controlled. Continue Amlodipine-Benazepril 5-20 mg daily. Low salt diet. Caution with chronic NSAID's use.

## 2020-11-05 NOTE — Assessment & Plan Note (Addendum)
Problem is well controlled. Meloxicam helps with pain and can be better tolerated given her hx of GERD. Following with rheumatologist.

## 2020-11-05 NOTE — Patient Instructions (Addendum)
A few things to remember from today's visit:  Vitamin D deficiency, unspecified  Rheumatoid arthritis involving multiple sites with positive rheumatoid factor ([place]), Chronic  Hypertension, essential, benign - Plan: COMPLETE METABOLIC PANEL WITH GFR  No cambios hoy. La proxima visita hacemos laboratorios en ayunas.  If you need refills please call your pharmacy. Do not use My Chart to request refills or for acute issues that need immediate attention.    Please be sure medication list is accurate. If a new problem present, please set up appointment sooner than planned today.

## 2020-11-05 NOTE — Progress Notes (Signed)
HPI: [name] is a 54 y.o. female, who is here today for 4 months follow up.   She was last seen on 06/09/20.  HTN: Dx'ed in 09/2019. She is on Amlodipine-Benazepril 5-20 mg daily. Negative for severe/frequent headache, visual changes, chest pain, dyspnea, palpitation, claudication, focal weakness, or edema. Eye pressure sensation, L>R after eye surgery. She has an appt with her ophthalmologist next week.  Lab Results  Component Value Date   CREATININE 0.65 03/12/2020   BUN 11 03/12/2020   NA 135 03/12/2020   K 4.3 03/12/2020   CL 98 03/12/2020   CO2 30 03/12/2020   Vitamin D deficiency: Currently she is on ergocalciferol 50,000 units weekly. Last 25 OH vitamin D on 11/27/2020 was low at 16. She had labs a couple weeks ago at her rheumatologist's office.  A few months ago she was seen a couple times (06/09/20 and 05/14/20) because upper abdominal pain, nausea,and vomiting. She has not seen GI yet, did not receive appt information.  She completed treatment for H. Pylori and symptoms resolved.  She still has acid reflux with certain foods like bread. She is not taking Omeprazole.  Negative for abdominal pain,changes in bowel habits, blood in stool or melena.  RA: She follows with rheumatologist. She is on Meloxican 15 mg daily prn, medication helps with joint pain.   Review of Systems  Constitutional: Negative for activity change, appetite change, fatigue and fever.  HENT: Negative for mouth sores, nosebleeds, sore throat and trouble swallowing.   Eyes: Negative for redness and visual disturbance.  Respiratory: Negative for cough and wheezing.   Gastrointestinal: Negative for abdominal pain, nausea and vomiting.       Negative for changes in bowel habits.  Genitourinary: Negative for decreased urine volume, dysuria and hematuria.  Neurological: Negative for syncope, facial asymmetry and weakness.  Rest of ROS, see pertinent positives sand negatives in  HPI  Current Outpatient Medications on File Prior to Visit  Medication Sig Dispense Refill  . amLODipine-benazepril (LOTREL) 5-20 MG capsule Take 1 capsule by mouth daily. 90 capsule 2  . meloxicam (MOBIC) 15 MG tablet Take 15 mg by mouth daily.    . norethindrone (MICRONOR) 0.35 MG tablet Take 1 tablet (0.35 mg total) by mouth daily. 84 tablet 4  . ORENCIA CLICKJECT 979 MG/ML SOAJ Inject 1 Syringe into the skin once a week.    . Vitamin D, Ergocalciferol, (DRISDOL) 1.25 MG (50000 UT) CAPS capsule Take 50,000 Units by mouth every 7 (seven) days.     No current facility-administered medications on file prior to visit.   Past Medical History:  Diagnosis Date  . Bronchitis   . Cataract    had sx   . Hypertension   . Rheumatoid arthritis ([place])   . Thyroid disease   . UTI (urinary tract infection)    Allergies  Allergen Reactions  . Morphine And Related Other (See Comments)    Fast heart beat  . Ciprofloxacin Rash  . Oxycodone Palpitations  . Tape Dermatitis    Social History   Socioeconomic History  . Marital status: Married    Spouse name: Not on file  . Number of children: Not on file  . Years of education: Not on file  . Highest education level: Not on file  Occupational History  . Not on file  Tobacco Use  . Smoking status: Never Smoker  . Smokeless tobacco: Never Used  Vaping Use  . Vaping Use: Never used  Substance and Sexual Activity  . Alcohol use: No    Alcohol/week: 0.0 standard drinks  . Drug use: No  . Sexual activity: Yes    Partners: Male    Birth control/protection: Other-see comments    Comment: 1st intercourse- 50, partners- 2, married- 36 yrs   Other Topics Concern  . Not on file  Social History Narrative   Right handed   One story   No caffeine x 2 weeks   Social Determinants of Health   Financial Resource Strain:   . Difficulty of Paying Living Expenses: Not on file  Food Insecurity:   . Worried About Charity fundraiser in the Last  Year: Not on file  . Ran Out of Food in the Last Year: Not on file  Transportation Needs:   . Lack of Transportation (Medical): Not on file  . Lack of Transportation (Non-Medical): Not on file  Physical Activity:   . Days of Exercise per Week: Not on file  . Minutes of Exercise per Session: Not on file  Stress:   . Feeling of Stress : Not on file  Social Connections:   . Frequency of Communication with Friends and Family: Not on file  . Frequency of Social Gatherings with Friends and Family: Not on file  . Attends Religious Services: Not on file  . Active Member of Clubs or Organizations: Not on file  . Attends Archivist Meetings: Not on file  . Marital Status: Not on file   Vitals:   11/05/20 1538  BP: 124/76  Pulse: 88  Resp: 16  Temp: 98.7 F (37.1 C)  SpO2: 96%   Wt Readings from Last 3 Encounters:  11/05/20 156 lb 12.8 oz (71.1 kg)  08/07/20 149 lb 9.6 oz (67.9 kg)  06/09/20 146 lb 2 oz (66.3 kg)    Body mass index is 28.68 kg/m.  Physical Exam Vitals and nursing note reviewed.  Constitutional:      General: She is not in acute distress.    Appearance: She is well-developed.  HENT:     Head: Normocephalic and atraumatic.     Mouth/Throat:     Mouth: Mucous membranes are moist.     Pharynx: Oropharynx is clear.  Eyes:     Conjunctiva/sclera: Conjunctivae normal.     Pupils: Pupils are equal, round, and reactive to light.  Cardiovascular:     Rate and Rhythm: Normal rate and regular rhythm.     Pulses:          Dorsalis pedis pulses are 2+ on the right side and 2+ on the left side.     Heart sounds: No murmur heard.   Pulmonary:     Effort: Pulmonary effort is normal. No respiratory distress.     Breath sounds: Normal breath sounds.  Abdominal:     Palpations: Abdomen is soft. There is no hepatomegaly or mass.     Tenderness: There is no abdominal tenderness.  Lymphadenopathy:     Cervical: No cervical adenopathy.  Skin:    General: Skin  is warm.     Findings: No erythema or rash.  Neurological:     General: No focal deficit present.     Mental Status: She is alert and oriented to person, place, and time.     Cranial Nerves: No cranial nerve deficit.     Gait: Gait normal.  Psychiatric:     Comments: Well groomed, good eye contact.   ASSESSMENT AND PLAN:  Ms.  Tracey Wood was seen today for 6 months follow-up.  Orders Placed This Encounter  Procedures  . COMPLETE METABOLIC PANEL WITH GFR   Lab Results  Component Value Date   CREATININE 0.83 11/05/2020   BUN 12 11/05/2020   NA 141 11/05/2020   K 4.2 11/05/2020   CL 105 11/05/2020   CO2 29 11/05/2020   Lab Results  Component Value Date   ALT 21 11/05/2020   AST 17 11/05/2020   ALKPHOS 71 10/08/2019   BILITOT 0.3 11/05/2020   Vitamin D deficiency, unspecified Complete Ergocalciferol 50,000 U weekly then she can continue Vit D3 2000 U daily.  Rheumatoid arthritis involving multiple sites with positive rheumatoid factor ([place]) Problem is well controlled. Meloxicam helps with pain and can be better tolerated given her hx of GERD. Following with rheumatologist.  Hypertension, essential, benign BP is adequately controlled. Continue Amlodipine-Benazepril 5-20 mg daily. Low salt diet. Caution with chronic NSAID's use.  GERD (gastroesophageal reflux disease) Symptoms greatly improved after H. Pylori was treated. GERD precautions recommended. Recommend continuing Omeprazole 20 mg daily.  Overweight (BMI 25.0-29.9) Has gained about 10 Lb sine 05/2020. We discussed the importance of following a healthier diet and regular physical activity. She is planning on changing her diet, consistency is very important.  Spent 44 minutes with pt.  During this time history was obtained and documented, examination was performed, prior labs reviewed, and assessment/plan discussed.  Return in about 5 months (around 04/05/2021) for CPE.  Delorse Shane G. Martinique,  MD  [facility]. [place] office.  A few things to remember from today's visit:  Vitamin D deficiency, unspecified  Rheumatoid arthritis involving multiple sites with positive rheumatoid factor ([place]), Chronic  Hypertension, essential, benign - Plan: COMPLETE METABOLIC PANEL WITH GFR  No cambios hoy. La proxima visita hacemos laboratorios en ayunas.  If you need refills please call your pharmacy. Do not use My Chart to request refills or for acute issues that need immediate attention.    Please be sure medication list is accurate. If a new problem present, please set up appointment sooner than planned today.

## 2020-11-05 NOTE — Assessment & Plan Note (Signed)
Symptoms greatly improved after H. Pylori was treated. GERD precautions recommended. Recommend continuing Omeprazole 20 mg daily.

## 2020-11-06 LAB — COMPLETE METABOLIC PANEL WITH GFR
AG Ratio: 1.5 (calc) (ref 1.0–2.5)
ALT: 21 U/L (ref 6–29)
AST: 17 U/L (ref 10–35)
Albumin: 4.1 g/dL (ref 3.6–5.1)
Alkaline phosphatase (APISO): 90 U/L (ref 37–153)
BUN: 12 mg/dL (ref 7–25)
CO2: 29 mmol/L (ref 20–32)
Calcium: 9.5 mg/dL (ref 8.6–10.4)
Chloride: 105 mmol/L (ref 98–110)
Creat: 0.83 mg/dL (ref 0.50–1.05)
GFR, Est African American: 94 mL/min/{1.73_m2} (ref >=60)
GFR, Est Non African American: 81 mL/min/{1.73_m2} (ref >=60)
Globulin: 2.8 g/dL (calc) (ref 1.9–3.7)
Glucose, Bld: 106 mg/dL — ABNORMAL HIGH (ref 65–99)
Potassium: 4.2 mmol/L (ref 3.5–5.3)
Sodium: 141 mmol/L (ref 135–146)
Total Bilirubin: 0.3 mg/dL (ref 0.2–1.2)
Total Protein: 6.9 g/dL (ref 6.1–8.1)

## 2021-02-09 ENCOUNTER — Other Ambulatory Visit: Payer: Self-pay | Admitting: Family Medicine

## 2021-02-09 DIAGNOSIS — K219 Gastro-esophageal reflux disease without esophagitis: Secondary | ICD-10-CM

## 2021-02-22 ENCOUNTER — Other Ambulatory Visit: Payer: Self-pay | Admitting: Family Medicine

## 2021-02-22 DIAGNOSIS — I10 Essential (primary) hypertension: Secondary | ICD-10-CM

## 2021-04-06 ENCOUNTER — Other Ambulatory Visit: Payer: Self-pay

## 2021-04-06 ENCOUNTER — Encounter: Payer: Self-pay | Admitting: Family Medicine

## 2021-04-06 ENCOUNTER — Ambulatory Visit (INDEPENDENT_AMBULATORY_CARE_PROVIDER_SITE_OTHER): Payer: 59 | Admitting: Family Medicine

## 2021-04-06 VITALS — BP 110/80 | HR 83 | Temp 98.4°F | Resp 12 | Ht 62.0 in | Wt 158.4 lb

## 2021-04-06 DIAGNOSIS — E782 Mixed hyperlipidemia: Secondary | ICD-10-CM | POA: Diagnosis not present

## 2021-04-06 DIAGNOSIS — I10 Essential (primary) hypertension: Secondary | ICD-10-CM | POA: Diagnosis not present

## 2021-04-06 DIAGNOSIS — M79601 Pain in right arm: Secondary | ICD-10-CM

## 2021-04-06 DIAGNOSIS — N390 Urinary tract infection, site not specified: Secondary | ICD-10-CM

## 2021-04-06 DIAGNOSIS — M542 Cervicalgia: Secondary | ICD-10-CM

## 2021-04-06 DIAGNOSIS — M0579 Rheumatoid arthritis with rheumatoid factor of multiple sites without organ or systems involvement: Secondary | ICD-10-CM

## 2021-04-06 DIAGNOSIS — R300 Dysuria: Secondary | ICD-10-CM | POA: Diagnosis not present

## 2021-04-06 MED ORDER — NITROFURANTOIN MONOHYD MACRO 100 MG PO CAPS: 100.0000 mg | ORAL_CAPSULE | Freq: Two times a day (BID) | ORAL | 0 refills | Status: AC

## 2021-04-06 MED ORDER — PREDNISONE 20 MG PO TABS: ORAL_TABLET | ORAL | 0 refills | Status: AC

## 2021-04-06 NOTE — Assessment & Plan Note (Addendum)
Currently on Orencia 125 mg weekly and meloxicam 15 mg daily as needed. Following with rheumatologist.

## 2021-04-06 NOTE — Patient Instructions (Signed)
A few things to remember from today's visit:   Hypertension, essential, benign  Hyperlipidemia, mixed - Plan: Lipid panel, Lipid panel  Dysuria - Plan: Urinalysis, Routine w reflex microscopic, Culture, Urine, Urinalysis, Routine w reflex microscopic, Culture, Urine  Diffuse pain in right upper extremity  If you need refills please call your pharmacy. Do not use My Chart to request refills or for acute issues that need immediate attention.   Prednisone con desayuno. Si el dolor no mejora en algunas semansa vamos a necesitar MRI de la nuca.   Please be sure medication list is accurate. If a new problem present, please set up appointment sooner than planned today.

## 2021-04-06 NOTE — Assessment & Plan Note (Signed)
BP adequately controlled. We discussed diagnosis and prognosis. Continue Amlodipine-benazepril 5-20 mg daily. Monitor BP once per week at home. Continue low-salt diet.

## 2021-04-06 NOTE — Assessment & Plan Note (Signed)
Nonpharmacologic treatment recommended for now. Further recommendation will be given according to lipid panel results.

## 2021-04-06 NOTE — Progress Notes (Signed)
HPI: Tracey Wood is a pleasant 55 y.o. female, who is here today for 6 months follow up.   She was last seen on 11/05/20.  Since her last visit she has seen her rheumatologist. RA on Orencia 125 mcg weekly.  Had blood done on 03/19/21: CBC, CMP, and 25 OH vit D.  HTN: She is on amlodipine-benazepril 5-20 mg daily. She wonders when she will be able to start medication. She denies checking BP regularly at home. Negative for unusual/frequent headache, visual changes, CP, dyspnea, palpitation, focal neurologic deficit, or edema. On 03/19/2021 creatinine was 0.59 and EGFR 180. Potassium 4.2 and sodium 140.  Vitamin D deficiency: Currently she is on ergocalciferol 50,000 units twice per week.  This problem is being followed by her rheumatologist. Last 64 OH vitamin D was mildly low at 29.2.  -Today she started with dysuria and urinary tenesmus. + Urgency. She has not noted gross hematuria, urine frequency, or decreased urine output. Negative for associated abdominal pain, vaginal bleeding or discharge. She has not tried OTC medication.  She is also c/o RUE pain, forearm and arm. For the past few days she has had right-sided cervical pain. Pain is intermittent. No associated arthralgias or limitation of ROM. It is interfering with sleep. Problem seems to be worse for the past 24 to 48 hours. No hx of trauma or unusual physical activity. 3 weeks ago she started a new job position, she has to do overhead activities but for a few minutes and once during the day.  She has not noted numbness, tingling, or burning. No skin rash. She is right handed.  Her rheumatologist added Flexeril to take daily as needed. She is also on meloxicam 50 mg daily as needed.  Negative for weakness, numbness,and tingling.  Hyperlipidemia: Currently she is on nonpharmacologic treatment. Lab Results  Component Value Date   CHOL 178 01/29/2020   HDL 47.00 01/29/2020   LDLCALC 118 (H)  03/14/2009   LDLDIRECT 116.0 01/29/2020   TRIG 217.0 (H) 01/29/2020   CHOLHDL 4 01/29/2020   Review of Systems  Constitutional: Positive for fatigue. Negative for activity change, appetite change and fever.  HENT: Negative for mouth sores, nosebleeds and sore throat.   Respiratory: Negative for cough and wheezing.   Gastrointestinal: Negative for abdominal pain, nausea and vomiting.       Negative for changes in bowel habits.  Genitourinary: Negative for decreased urine volume, flank pain and pelvic pain.  Musculoskeletal: Negative for gait problem.  Neurological: Negative for syncope and facial asymmetry.  Psychiatric/Behavioral: Negative for confusion. The patient is not nervous/anxious.   Rest of ROS, see pertinent positives sand negatives in HPI  Current Outpatient Medications on File Prior to Visit  Medication Sig Dispense Refill  . amLODipine-benazepril (LOTREL) 5-20 MG capsule TAKE 1 CAPSULE BY MOUTH EVERY DAY 90 capsule 2  . CYCLOBENZAPRINE HCL PO Take by mouth as needed.    . meloxicam (MOBIC) 15 MG tablet Take 15 mg by mouth daily.    . Vitamin D, Ergocalciferol, (DRISDOL) 1.25 MG (50000 UT) CAPS capsule Take 50,000 Units by mouth every 7 (seven) days.    Marland Kitchen ORENCIA CLICKJECT 492 MG/ML SOAJ Inject 1 Syringe into the skin once a week. (Patient not taking: Reported on 04/06/2021)     No current facility-administered medications on file prior to visit.    Past Medical History:  Diagnosis Date  . Bronchitis   . Cataract    had sx   . Hypertension   .  Rheumatoid arthritis ([place])   . Thyroid disease   . UTI (urinary tract infection)    Allergies  Allergen Reactions  . Morphine And Related Other (See Comments)    Fast heart beat  . Ciprofloxacin Rash  . Oxycodone Palpitations  . Tape Dermatitis    Social History   Socioeconomic History  . Marital status: Married    Spouse name: Not on file  . Number of children: Not on file  . Years of education: Not on file  .  Highest education level: Not on file  Occupational History  . Not on file  Tobacco Use  . Smoking status: Never Smoker  . Smokeless tobacco: Never Used  Vaping Use  . Vaping Use: Never used  Substance and Sexual Activity  . Alcohol use: No    Alcohol/week: 0.0 standard drinks  . Drug use: No  . Sexual activity: Yes    Partners: Male    Birth control/protection: Other-see comments    Comment: 1st intercourse- 33, partners- 2, married- 75 yrs   Other Topics Concern  . Not on file  Social History Narrative   Right handed   One story   No caffeine x 2 weeks   Social Determinants of Health   Financial Resource Strain: Not on file  Food Insecurity: Not on file  Transportation Needs: Not on file  Physical Activity: Not on file  Stress: Not on file  Social Connections: Not on file    Vitals:   04/06/21 1624  BP: 110/80  Pulse: 83  Resp: 12  Temp: 98.4 F (36.9 C)  SpO2: 96%   Body mass index is 28.97 kg/m.  Physical Exam Vitals and nursing note reviewed.  Constitutional:      General: She is not in acute distress.    Appearance: She is well-developed.  HENT:     Head: Normocephalic and atraumatic.     Mouth/Throat:     Mouth: Mucous membranes are moist.  Eyes:     Conjunctiva/sclera: Conjunctivae normal.  Cardiovascular:     Rate and Rhythm: Normal rate and regular rhythm.     Pulses:          Dorsalis pedis pulses are 2+ on the right side and 2+ on the left side.     Heart sounds: No murmur heard.   Pulmonary:     Effort: Pulmonary effort is normal. No respiratory distress.     Breath sounds: Normal breath sounds.  Abdominal:     Palpations: Abdomen is soft. There is no hepatomegaly or mass.     Tenderness: There is no abdominal tenderness. There is no right CVA tenderness or left CVA tenderness.  Musculoskeletal:     Right shoulder: No tenderness or bony tenderness. Normal range of motion. Normal pulse.     Cervical back: Spasms and tenderness present.  No edema or erythema. Normal range of motion.       Back:  Lymphadenopathy:     Cervical: No cervical adenopathy.  Skin:    General: Skin is warm.     Findings: No erythema or rash.  Neurological:     General: No focal deficit present.     Mental Status: She is alert and oriented to person, place, and time.     Cranial Nerves: No cranial nerve deficit.     Gait: Gait normal.  Psychiatric:     Comments: Well groomed, good eye contact.   ASSESSMENT AND PLAN:  [name] was seen today  for 6 months follow-up.  Orders Placed This Encounter  Procedures  . Culture, Urine  . Urinalysis, Routine w reflex microscopic  . Lipid panel  . LDL cholesterol, direct   Lab Results  Component Value Date   CHOL 232 (H) 04/06/2021   HDL 53.60 04/06/2021   LDLCALC 118 (H) 03/14/2009   LDLDIRECT 174.0 04/06/2021   TRIG 249.0 (H) 04/06/2021   CHOLHDL 4 04/06/2021   The 10-year ASCVD risk score Mikey Bussing DC Jr., et al., 2013) is: 1.8%   Values used to calculate the score:     Age: 24 years     Sex: Female     Is Non-Hispanic African American: No     Diabetic: No     Tobacco smoker: No     Systolic Blood Pressure: 956 mmHg     Is BP treated: Yes     HDL Cholesterol: 53.6 mg/dL     Total Cholesterol: 232 mg/dL  Dysuria -     Urinalysis, Routine w reflex microscopic -     Culture, Urine  Diffuse pain in right upper extremity We discussed possible etiologies. ?  Radiculopathy. After discussion of some side effects, she agrees with trying prednisone taper. Recommend taking prednisone with food. If pain has not resolved in 4 to 6 weeks, cervical MRI needs to be considered.  -     predniSONE (DELTASONE) 20 MG tablet; 3 tabs for 3 days, 2 tabs for 3 days, 1 tabs for 3 days, and 1/2 tab for 3 days. Take tables together with breakfast.  Urinary tract infection without hematuria, site unspecified UA pending. Empiric treatment with Macrobid 100 mg twice daily x5 days was started  today. We will follow Ucx and tailor antibiotic treatment accordingly. Adequate hydration.  -     nitrofurantoin, macrocrystal-monohydrate, (MACROBID) 100 MG capsule; Take 1 capsule (100 mg total) by mouth 2 (two) times daily for 5 days.  Neck pain on right side She can take Flexeril daily at bedtime for 7 to 10 days. Currently she is on meloxicam 50 mg daily as needed. Follow-up as needed.  Hypertension, essential, benign BP adequately controlled. We discussed diagnosis and prognosis. Continue Amlodipine-benazepril 5-20 mg daily. Monitor BP once per week at home. Continue low-salt diet.   Hyperlipidemia, mixed Nonpharmacologic treatment recommended for now. Further recommendation will be given according to lipid panel results.  Rheumatoid arthritis involving multiple sites with positive rheumatoid factor ([facility]) Currently on Orencia 125 mg weekly and meloxicam 15 mg daily as needed. Following with rheumatologist.   Spent 42 minutes  During this time history was obtained and documented, examination was performed, prior labs reviewed, and assessment/plan discussed.  Return in about 6 months (around 10/06/2021) for HTN.   Jermeka Schlotterbeck G. Martinique, MD  [facility]. [place] office.   A few things to remember from today's visit:   Hypertension, essential, benign  Hyperlipidemia, mixed - Plan: Lipid panel, Lipid panel  Dysuria - Plan: Urinalysis, Routine w reflex microscopic, Culture, Urine, Urinalysis, Routine w reflex microscopic, Culture, Urine  Diffuse pain in right upper extremity  If you need refills please call your pharmacy. Do not use My Chart to request refills or for acute issues that need immediate attention.   Prednisone con desayuno. Si el dolor no mejora en algunas semansa vamos a necesitar MRI de la nuca.   Please be sure medication list is accurate. If a new problem present, please set up appointment sooner than planned  today.

## 2021-04-07 LAB — URINALYSIS, ROUTINE W REFLEX MICROSCOPIC
Bilirubin Urine: NEGATIVE
Ketones, ur: NEGATIVE
Nitrite: NEGATIVE
Specific Gravity, Urine: 1.025 (ref 1.000–1.030)
Total Protein, Urine: NEGATIVE
Urine Glucose: NEGATIVE
Urobilinogen, UA: 0.2 (ref 0.0–1.0)
pH: 5.5 (ref 5.0–8.0)

## 2021-04-07 LAB — LIPID PANEL
Cholesterol: 232 mg/dL — ABNORMAL HIGH (ref 0–200)
HDL: 53.6 mg/dL (ref >39.00)
NonHDL: 178.33
Total CHOL/HDL Ratio: 4
Triglycerides: 249 mg/dL — ABNORMAL HIGH (ref 0.0–149.0)
VLDL: 49.8 mg/dL — ABNORMAL HIGH (ref 0.0–40.0)

## 2021-04-07 LAB — LDL CHOLESTEROL, DIRECT: Direct LDL: 174 mg/dL

## 2021-04-08 LAB — URINE CULTURE
MICRO NUMBER:: 11754717
SPECIMEN QUALITY:: ADEQUATE

## 2021-04-16 ENCOUNTER — Ambulatory Visit: Payer: 59 | Admitting: Internal Medicine

## 2021-04-16 ENCOUNTER — Encounter: Payer: Self-pay | Admitting: Internal Medicine

## 2021-04-16 ENCOUNTER — Other Ambulatory Visit: Payer: Self-pay

## 2021-04-16 VITALS — BP 108/70 | HR 89 | Temp 98.2°F | Wt 161.6 lb

## 2021-04-16 DIAGNOSIS — R319 Hematuria, unspecified: Secondary | ICD-10-CM | POA: Diagnosis not present

## 2021-04-16 DIAGNOSIS — N390 Urinary tract infection, site not specified: Secondary | ICD-10-CM | POA: Diagnosis not present

## 2021-04-16 DIAGNOSIS — R300 Dysuria: Secondary | ICD-10-CM | POA: Diagnosis not present

## 2021-04-16 LAB — POCT URINALYSIS DIPSTICK
Bilirubin, UA: NEGATIVE
Glucose, UA: NEGATIVE
Ketones, UA: NEGATIVE
Nitrite, UA: POSITIVE
Protein, UA: POSITIVE — AB
Spec Grav, UA: 1.02 (ref 1.010–1.025)
Urobilinogen, UA: 0.2 E.U./dL
pH, UA: 6 (ref 5.0–8.0)

## 2021-04-16 MED ORDER — CEPHALEXIN 500 MG PO CAPS: 500.0000 mg | ORAL_CAPSULE | Freq: Three times a day (TID) | ORAL | 0 refills | Status: AC

## 2021-04-16 NOTE — Patient Instructions (Signed)
-Nice seeing you today!!  -Start keflex 500 mg 3 times a day for 10 days.  -CT scan of your kidneys and referral to urology has been requested.   Infeccin urinaria en los adultos Urinary Tract Infection, Adult  Una infeccin urinaria (IU) puede ocurrir en Clinical cytogeneticist de las vas [place]. Las vas urinarias incluyen a los riones, los urteres, la vejiga y Geologist, engineering. Estos rganos fabrican, Buyer, retail y eliminan la orina del organismo. La IU alta afecta los urteres y los riones. La IU baja afecta la vejiga y Geologist, engineering. Cules son las causas? La mayora de las infecciones de las vas urinarias es causada por la presencia de bacterias en la zona genital, alrededor de la uretra, por donde sale la orina del cuerpo. Estas bacterias proliferan y causan inflamacin en las vas urinarias. Qu incrementa el riesgo? Es ms probable que sufra esta afeccin si:  Tiene colocado un catter [employer].  No puede controlar cundo Production assistant, radio (incontinencia).  Es mujer y usted: ? [place] espermicida o diafragma como mtodo anticonceptivo. ? Tiene niveles bajos de estrgenos. ? Est embarazada.  Tiene ciertos genes que [employer].  Es sexualmente activa.  Toma antibiticos.  Tiene una afeccin que causa que el flujo de orina sea [place], como: ? Prstata agrandada, si usted es hombre. ? Obstruccin de Geologist, engineering. ? Clculo renal. ? Una afeccin nerviosa que afecta el control de la vejiga (vejiga neurgena). ? No bebe lo suficiente o no orina con frecuencia.  Tiene ciertas enfermedades crnicas, como: ? Diabetes. ? Un sistema que combate las enfermedades (sistemainmunitario) debilitado. ? Anemia drepanoctica. ? Gota. ? Lesin en la mdula espinal. Cules son los signos o sntomas? Los sntomas de esta afeccin incluyen:  Necesidad inmediata (urgencia) de Garment/textile technologist.  Miccin frecuente. Esto puede incluir pequeas cantidades de [place] cada vez que  [place].  Ardor o dolor al [employer].  Presencia de [employer].  Orina con mal olor u [employer] atpico.  Dificultad para Garment/textile technologist.  Bennie Hind turbia.  Secrecin vaginal, si es mujer.  Dolor en el abdomen o en la parte inferior de la espalda. Es posible que tambin tenga:  Vmitos o disminucin del apetito.  Confusin.  Irritabilidad o cansancio.  Fiebre o escalofros.  Diarrea. El Psychologist, educational sntoma en los adultos mayores puede ser la confusin. En algunos casos, es posible que no tengan sntomas hasta que la infeccin empeore. Cmo se diagnostica? Esta afeccin se diagnostica en funcin de sus antecedentes mdicos y de un examen fsico. Tambin pueden hacerle otras pruebas, incluidas las siguientes:  Anlisis de [place].  Anlisis de [place].  Pruebas de infecciones de transmisin sexual (ITS). Si ha tenido ms de una infeccin urinaria (IU), se pueden hacer estudios de diagnstico por imgenes o una cistoscopia para determinar la causa de las infecciones. Cmo se trata? El tratamiento de esta afeccin incluye lo siguiente:  Antibiticos.  Medicamentos de [place] molestias.  Beber una cantidad suficiente agua para mantenerse hidratado. Si tiene infecciones con frecuencia o tiene otras afecciones, como un clculo renal, es posible que deba ver a un mdico especialista en las vas urinarias (urlogo). En casos poco frecuentes, las infecciones urinarias pueden provocar sepsis. La sepsis es una afeccin potencialmente mortal que se produce cuando el cuerpo responde a una infeccin. La sepsis se trata en el hospital con antibiticos, lquidos y otros medicamentos que se administran por va intravenosa. Siga estas instrucciones en su casa: Medicamentos  Use los medicamentos de venta  libre y los recetados solamente como se lo haya indicado el mdico.  Si le recetaron un antibitico, tmelo como se lo haya indicado el mdico. No deje de usar el antibitico aunque  comience a Sports administrator. Instrucciones generales  Asegrese de hacer lo siguiente: ? Vaciar la vejiga con frecuencia y en su totalidad. No contener la orina durante largos perodos. ? Vaciar la vejiga despus de [employer]. ? Limpiarse de atrs hacia adelante despus de orinar o defecar, si es mujer. Usar cada trozo de papel higinico solo una vez cuando se limpie.  Beber suficiente lquido como para Theatre manager la orina de color amarillo plido.  Cumpla con todas las visitas de seguimiento. Esto es importante.   Comunquese con un mdico si:  Los sntomas no mejoran despus de 1 o 2das de tratamiento.  Los sntomas desaparecen y luego vuelven a Arts administrator. Solicite ayuda de inmediato si:  Siente dolor intenso en la espalda o en la parte inferior del abdomen.  Tiene fiebre o escalofros.  Tiene nuseas o vmitos. Resumen  Una infeccin urinaria (IU) es una infeccin en cualquier parte de las vas urinarias, que [employer] riones, los urteres, la vejiga y Geologist, engineering.  [place] infecciones de las vas urinarias es causada por bacterias en la zona genital.  El tratamiento de esta afeccin suele incluir antibiticos.  Si le recetaron un antibitico, tmelo como se lo haya indicado el mdico. No deje de usar el antibitico aunque comience a Sports administrator.  Cumpla con todas las visitas de seguimiento. Esto es importante. Esta informacin no tiene Marine scientist el consejo del mdico. Asegrese de hacerle al mdico cualquier pregunta que tenga. Document Revised: 10/06/2020 Document Reviewed: 10/06/2020 Elsevier Patient Education  2021 [employer].

## 2021-04-16 NOTE — Progress Notes (Signed)
Acute office Visit     This visit occurred during the SARS-CoV-2 public health emergency.  Safety protocols were in place, including screening questions prior to the visit, additional usage of staff PPE, and extensive cleaning of exam room while observing appropriate contact time as indicated for disinfecting solutions.    CC/Reason for Visit: Left flank pain, dysuria  HPI: Tracey Wood is a 55 y.o. female who is coming in today for the above mentioned reasons.  She was seen on April 11 and treated with Macrobid for a UTI that was later noted to be E. coli.  It is resistant to Bactrim.  She is allergic to Cipro.  She continues to have left flank pain, urinary frequency and urgency, dysuria as well as suprapubic pain and pressure.  She denies nausea and vomiting or fevers.  She tells me that about 3 years ago she was referred to a urologist in [facility] due to recurrent hematuria she had a cystoscopy and was told "she had red spots in her bladder".  But there was no follow-up.  Past Medical/Surgical History: Past Medical History:  Diagnosis Date  . Bronchitis   . Cataract    had sx   . Hypertension   . Rheumatoid arthritis ([place])   . Thyroid disease   . UTI (urinary tract infection)     Past Surgical History:  Procedure Laterality Date  . CATARACT EXTRACTION     R & L  . TUBAL LIGATION      Social History:  reports that she has never smoked. She has never used smokeless tobacco. She reports that she does not drink alcohol and does not use drugs.  Allergies: Allergies  Allergen Reactions  . Morphine And Related Other (See Comments)    Fast heart beat  . Ciprofloxacin Rash  . Oxycodone Palpitations  . Tape Dermatitis    Family History:  Family History  Problem Relation Age of Onset  . Breast cancer Maternal Grandmother 23  . Colon cancer Neg Hx   . Colon polyps Neg Hx   . Esophageal cancer Neg Hx   . Rectal cancer Neg Hx   . Stomach cancer Neg Hx       Current Outpatient Medications:  .  amLODipine-benazepril (LOTREL) 5-20 MG capsule, TAKE 1 CAPSULE BY MOUTH EVERY DAY, Disp: 90 capsule, Rfl: 2 .  cephALEXin (KEFLEX) 500 MG capsule, Take 1 capsule (500 mg total) by mouth 3 (three) times daily for 10 days., Disp: 30 capsule, Rfl: 0 .  CYCLOBENZAPRINE HCL PO, Take by mouth as needed., Disp: , Rfl:  .  meloxicam (MOBIC) 15 MG tablet, Take 15 mg by mouth daily., Disp: , Rfl:  .  ORENCIA CLICKJECT 761 MG/ML SOAJ, Inject 1 Syringe into the skin once a week., Disp: , Rfl:  .  Vitamin D, Ergocalciferol, (DRISDOL) 1.25 MG (50000 UT) CAPS capsule, Take 50,000 Units by mouth every 7 (seven) days., Disp: , Rfl:   Review of Systems:  Constitutional: Denies fever, chills, diaphoresis, appetite change and fatigue.  HEENT: Denies photophobia, eye pain, redness, hearing loss, ear pain, congestion, sore throat, rhinorrhea, sneezing, mouth sores, trouble swallowing, neck pain, neck stiffness and tinnitus.   Respiratory: Denies SOB, DOE, cough, chest tightness,  and wheezing.   Cardiovascular: Denies chest pain, palpitations and leg swelling.  Gastrointestinal: Denies nausea, vomiting, , diarrhea, constipation, blood in stool and abdominal distention.  Genitourinary: Positive for dysuria, urgency, frequency, hematuria, flank pain and difficulty urinating.  Endocrine:  Denies: hot or cold intolerance, sweats, changes in hair or nails, polyuria, polydipsia. Musculoskeletal: Denies myalgias, back pain, joint swelling, arthralgias and gait problem.  Skin: Denies pallor, rash and wound.  Neurological: Denies dizziness, seizures, syncope, weakness, light-headedness, numbness and headaches.  Hematological: Denies adenopathy. Easy bruising, personal or family bleeding history  Psychiatric/Behavioral: Denies suicidal ideation, mood changes, confusion, nervousness, sleep disturbance and agitation    Physical Exam: Vitals:   04/16/21 1604  BP: 108/70  Pulse: 89   Temp: 98.2 F (36.8 C)  SpO2: 97%  Weight: 161 lb 9.6 oz (73.3 kg)    Body mass index is 29.56 kg/m.   Constitutional: NAD, calm, comfortable Eyes: PERRL, lids and conjunctivae normal ENMT: Mucous membranes are moist.  Respiratory: clear to auscultation bilaterally, no wheezing, no crackles. Normal respiratory effort. No accessory muscle use.  Cardiovascular: Regular rate and rhythm, no murmurs / rubs / gallops. No extremity edema.  Abdomen: no tenderness, no masses palpated. No hepatosplenomegaly. Bowel sounds positive.  Neurologic: Grossly intact and nonfocal Psychiatric: Normal judgment and insight. Alert and oriented x 3. Normal mood.    Impression and Plan:  Recurrent UTI   Dysuria   Hematuria, unspecified type   -Recent urine culture with E. coli UTI resistant to ampicillin and Bactrim. -Dipstick in office today with 3+ blood, 3+ leukocytes and positive for nitrates. -I will send for UA and culture. -I will send in a prescription of Keflex to take 3 times a day for 10 days. -Given the recurrent nature of her hematuria I feel it is appropriate to do another CT scan of the abdomen and refer to urology for second opinion.  Patient agrees.  Time spent: 31 minutes reviewing chart, collecting history and formulating plan of care.    Patient Instructions   -Nice seeing you today!!  -Start keflex 500 mg 3 times a day for 10 days.  -CT scan of your kidneys and referral to urology has been requested.   Infeccin urinaria en los adultos Urinary Tract Infection, Adult  Una infeccin urinaria (IU) puede ocurrir en Clinical cytogeneticist de las vas [place]. Las vas urinarias incluyen a los riones, los urteres, la vejiga y Geologist, engineering. Estos rganos fabrican, Buyer, retail y eliminan la orina del organismo. La IU alta afecta los urteres y los riones. La IU baja afecta la vejiga y Geologist, engineering. Cules son las causas? La mayora de las infecciones de las vas urinarias es causada  por la presencia de bacterias en la zona genital, alrededor de la uretra, por donde sale la orina del cuerpo. Estas bacterias proliferan y causan inflamacin en las vas urinarias. Qu incrementa el riesgo? Es ms probable que sufra esta afeccin si:  Tiene colocado un catter [employer].  No puede controlar cundo Production assistant, radio (incontinencia).  Es mujer y usted: ? [place] espermicida o diafragma como mtodo anticonceptivo. ? Tiene niveles bajos de estrgenos. ? Est embarazada.  Tiene ciertos genes que [employer].  Es sexualmente activa.  Toma antibiticos.  Tiene una afeccin que causa que el flujo de orina sea [place], como: ? Prstata agrandada, si usted es hombre. ? Obstruccin de Geologist, engineering. ? Clculo renal. ? Una afeccin nerviosa que afecta el control de la vejiga (vejiga neurgena). ? No bebe lo suficiente o no orina con frecuencia.  Tiene ciertas enfermedades crnicas, como: ? Diabetes. ? Un sistema que combate las enfermedades (sistemainmunitario) debilitado. ? Anemia drepanoctica. ? Gota. ? Lesin en la mdula espinal. Cules son los signos o sntomas?  Los sntomas de esta afeccin incluyen:  Necesidad inmediata (urgencia) de Garment/textile technologist.  Miccin frecuente. Esto puede incluir pequeas cantidades de [place] cada vez que [place].  Ardor o dolor al [employer].  Presencia de [employer].  Orina con mal olor u [employer] atpico.  Dificultad para Garment/textile technologist.  Bennie Hind turbia.  Secrecin vaginal, si es mujer.  Dolor en el abdomen o en la parte inferior de la espalda. Es posible que tambin tenga:  Vmitos o disminucin del apetito.  Confusin.  Irritabilidad o cansancio.  Fiebre o escalofros.  Diarrea. El Psychologist, educational sntoma en los adultos mayores puede ser la confusin. En algunos casos, es posible que no tengan sntomas hasta que la infeccin empeore. Cmo se diagnostica? Esta afeccin se diagnostica en funcin de sus antecedentes mdicos y de  un examen fsico. Tambin pueden hacerle otras pruebas, incluidas las siguientes:  Anlisis de [place].  Anlisis de [place].  Pruebas de infecciones de transmisin sexual (ITS). Si ha tenido ms de una infeccin urinaria (IU), se pueden hacer estudios de diagnstico por imgenes o una cistoscopia para determinar la causa de las infecciones. Cmo se trata? El tratamiento de esta afeccin incluye lo siguiente:  Antibiticos.  Medicamentos de [place] molestias.  Beber una cantidad suficiente agua para mantenerse hidratado. Si tiene infecciones con frecuencia o tiene otras afecciones, como un clculo renal, es posible que deba ver a un mdico especialista en las vas urinarias (urlogo). En casos poco frecuentes, las infecciones urinarias pueden provocar sepsis. La sepsis es una afeccin potencialmente mortal que se produce cuando el cuerpo responde a una infeccin. La sepsis se trata en el hospital con antibiticos, lquidos y otros medicamentos que se administran por va intravenosa. Siga estas instrucciones en su casa: Medicamentos  Use los medicamentos de venta libre y los recetados solamente como se lo haya indicado el mdico.  Si le recetaron un antibitico, tmelo como se lo haya indicado el mdico. No deje de usar el antibitico aunque comience a Sports administrator. Instrucciones generales  Asegrese de hacer lo siguiente: ? Vaciar la vejiga con frecuencia y en su totalidad. No contener la orina durante largos perodos. ? Vaciar la vejiga despus de [employer]. ? Limpiarse de atrs hacia adelante despus de orinar o defecar, si es mujer. Usar cada trozo de papel higinico solo una vez cuando se limpie.  Beber suficiente lquido como para Theatre manager la orina de color amarillo plido.  Cumpla con todas las visitas de seguimiento. Esto es importante.   Comunquese con un mdico si:  Los sntomas no mejoran despus de 1 o 2das de tratamiento.  Los sntomas  desaparecen y luego vuelven a Arts administrator. Solicite ayuda de inmediato si:  Siente dolor intenso en la espalda o en la parte inferior del abdomen.  Tiene fiebre o escalofros.  Tiene nuseas o vmitos. Resumen  Una infeccin urinaria (IU) es una infeccin en cualquier parte de las vas urinarias, que [employer] riones, los urteres, la vejiga y Geologist, engineering.  [place] infecciones de las vas urinarias es causada por bacterias en la zona genital.  El tratamiento de esta afeccin suele incluir antibiticos.  Si le recetaron un antibitico, tmelo como se lo haya indicado el mdico. No deje de usar el antibitico aunque comience a Sports administrator.  Cumpla con todas las visitas de seguimiento. Esto es importante. Esta informacin no tiene Marine scientist el consejo del mdico. Asegrese de hacerle al mdico cualquier pregunta que tenga. Document Revised: 10/06/2020 Document  Reviewed: 10/06/2020 Elsevier Patient Education  2021 [place], MD [facility]

## 2021-04-17 LAB — URINALYSIS, ROUTINE W REFLEX MICROSCOPIC
Bilirubin Urine: NEGATIVE
Ketones, ur: NEGATIVE
Nitrite: POSITIVE — AB
Specific Gravity, Urine: 1.01 (ref 1.000–1.030)
Urine Glucose: NEGATIVE
Urobilinogen, UA: 0.2 (ref 0.0–1.0)
pH: 6 (ref 5.0–8.0)

## 2021-04-19 LAB — URINE CULTURE
MICRO NUMBER:: 11797979
SPECIMEN QUALITY:: ADEQUATE

## 2021-04-28 ENCOUNTER — Other Ambulatory Visit: Payer: Self-pay

## 2021-04-28 ENCOUNTER — Ambulatory Visit: Payer: 59 | Admitting: Family Medicine

## 2021-04-28 ENCOUNTER — Encounter: Payer: Self-pay | Admitting: Family Medicine

## 2021-04-28 VITALS — BP 124/80 | HR 102 | Temp 98.8°F | Resp 16 | Ht 62.0 in | Wt 159.8 lb

## 2021-04-28 DIAGNOSIS — R310 Gross hematuria: Secondary | ICD-10-CM

## 2021-04-28 DIAGNOSIS — M7501 Adhesive capsulitis of right shoulder: Secondary | ICD-10-CM

## 2021-04-28 MED ORDER — TRAMADOL HCL 50 MG PO TABS: 50.0000 mg | ORAL_TABLET | Freq: Three times a day (TID) | ORAL | 0 refills | Status: AC | PRN

## 2021-04-28 NOTE — Progress Notes (Signed)
HPI:  TraceyTracey Wood is a 55 y.o. female with hx of HTN,HLD,and RA here today to follow on recent OV and ER visit. Evaluated in acute care for right shoulder pain, severe. I saw her on 04/06/21 for follow up, then she was having mild urinary symptoms.She was treated empirically with Nitrofurantoin x 5 days. 04/06/21 Ucx grew E. Coli sensitive to Nitrofurantoin.  Evaluated again for urine symptoms on 04/16/21, she was treated with Cephalexin.  Symptoms resolved but had some again the past weekend, dysuria and episode of gross hematuria x 1. She was referred to urologist and renal CT was ordered by Dr Jerilee Hoh.  She reports having similar urinary problems years ago, she underwent urologic work up and it was otherwise negative. She has not identified exacerbating factors. Negative for fever,chills,abdominal pain,N/V,or changes in bowel habits. No hx of tobacco use.  Right shoulder and RUE pain, completed prednisone treatment, temporal relief. She was evaluated at [facility] care on 04/23/21 for worsening right shoulder pain. Gradual onset, constant,sharp pain. Treated with Toradol and Dexamethasone IM.  Evaluated by ortho on 04/26/2021, received a subacromial steroid injection and today she has noted mild improvement. 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  OTC analgesics do not help much with pain. She takes Meloxicam prn for RA. She is right handed.  It is exacerbated by movement and alleviated by resting extremity on a pillow. Limitation of ROM.  Review of Systems  Constitutional: Positive for fatigue. Negative for chills and fever.  Respiratory: Negative for cough, shortness of breath and wheezing.   Cardiovascular: Negative for chest pain and palpitations.  Genitourinary: Negative for vaginal bleeding and vaginal discharge.  Musculoskeletal: Positive for neck pain. Negative for gait problem and joint swelling.   Hematological: Negative for adenopathy. Does not bruise/bleed easily.  Rest see pertinent positives and negatives per HPI.  Current Outpatient Medications on File Prior to Visit  Medication Sig Dispense Refill  . amLODipine-benazepril (LOTREL) 5-20 MG capsule TAKE 1 CAPSULE BY MOUTH EVERY DAY 90 capsule 2  . CYCLOBENZAPRINE HCL PO Take by mouth as needed.    . meloxicam (MOBIC) 15 MG tablet Take 15 mg by mouth daily.    Marland Kitchen ORENCIA CLICKJECT 546 MG/ML SOAJ Inject 1 Syringe into the skin once a week.    . Vitamin D, Ergocalciferol, (DRISDOL) 1.25 MG (50000 UT) CAPS capsule Take 50,000 Units by mouth every 7 (seven) days.     No current facility-administered medications on file prior to visit.   Past Medical History:  Diagnosis Date  . Bronchitis   . Cataract    had sx   . Hypertension   . Rheumatoid arthritis ([place])   . Thyroid disease   . UTI (urinary tract infection)    Allergies  Allergen Reactions  . Morphine And Related Other (See Comments)    Fast heart beat  . Ciprofloxacin Rash  . Oxycodone Palpitations  . Tape Dermatitis    Social History   Socioeconomic History  . Marital status: Married    Spouse name: Not on file  . Number of children: Not on file  . Years of education: Not on file  . Highest education level: Not on file  Occupational History  . Not on file  Tobacco Use  . Smoking status: Never Smoker  . Smokeless tobacco: Never Used  Vaping Use  . Vaping Use: Never used  Substance and Sexual Activity  . Alcohol  use: No    Alcohol/week: 0.0 standard drinks  . Drug use: No  . Sexual activity: Yes    Partners: Male    Birth control/protection: Other-see comments    Comment: 1st intercourse- 30, partners- 2, married- 28 yrs   Other Topics Concern  . Not on file  Social History Narrative   Right handed   One story   No caffeine x 2 weeks   Social Determinants of Health   Financial Resource Strain: Not on file  Food Insecurity: Not on file   Transportation Needs: Not on file  Physical Activity: Not on file  Stress: Not on file  Social Connections: Not on file    Vitals:   04/28/21 1533  BP: 124/80  Pulse: (!) 102  Resp: 16  Temp: 98.8 F (37.1 C)  SpO2: 98%   Body mass index is 29.23 kg/m.  Physical Exam Vitals and nursing note reviewed.  Constitutional:      General: She is not in acute distress.    Appearance: She is well-developed.  HENT:     Head: Normocephalic and atraumatic.  Eyes:     Conjunctiva/sclera: Conjunctivae normal.  Cardiovascular:     Rate and Rhythm: Normal rate and regular rhythm.     Pulses:          Radial pulses are 2+ on the right side.     Heart sounds: No murmur heard.     Comments: HR 84/min. Pulmonary:     Effort: Pulmonary effort is normal. No respiratory distress.     Breath sounds: Normal breath sounds.  Abdominal:     Palpations: Abdomen is soft. There is no hepatomegaly or mass.     Tenderness: There is no abdominal tenderness. There is no right CVA tenderness or left CVA tenderness.  Musculoskeletal:     Right shoulder: Tenderness present. No deformity, effusion or bony tenderness. Decreased range of motion. Normal pulse.     Comments: Marked limitation of active ROM, right shoulder. Moderate limitation of passive ROM, elicits pain.  Lymphadenopathy:     Cervical: No cervical adenopathy.  Skin:    General: Skin is warm.     Findings: No erythema or rash.  Neurological:     Mental Status: She is alert and oriented to person, place, and time.     Cranial Nerves: No cranial nerve deficit.     Gait: Gait normal.  Psychiatric:     Comments: Well groomed, good eye contact.    ASSESSMENT AND PLAN:  TraceyWood was seen today for follow-up.  Diagnoses and all orders for this visit:  Adhesive capsulitis of right shoulder PT was recommended and she is waiting for appt information. Today she feels better. Continue ROM exercises for now. After discussion of some side  effects she agrees with trying Tramadol, she has taken it before and well tolerated. Continue Meloxicam 15 mg daily as needed. Continue following with ortho.  -     traMADol (ULTRAM) 50 MG tablet; Take 1 tablet (50 mg total) by mouth every 8 (eight) hours as needed for up to 5 days.  Gross hematuria She has been treated x 2 with appropriate abx for UTI and symptoms have not yet resolved, so we need to consoder non infectious process. Urology evaluation and renal CT pending. Instructed about warning signs.  Return if symptoms worsen or fail to improve, for Keep next appt..  Ashaunti Treptow G. [place], MD  [facility]. [place] office.

## 2021-08-10 ENCOUNTER — Ambulatory Visit (INDEPENDENT_AMBULATORY_CARE_PROVIDER_SITE_OTHER): Payer: 59 | Admitting: Obstetrics & Gynecology

## 2021-08-10 ENCOUNTER — Encounter: Payer: Self-pay | Admitting: Obstetrics & Gynecology

## 2021-08-10 ENCOUNTER — Other Ambulatory Visit: Payer: Self-pay

## 2021-08-10 VITALS — BP 134/74 | HR 80 | Resp 20 | Ht 62.0 in | Wt 164.6 lb

## 2021-08-10 DIAGNOSIS — Z9851 Tubal ligation status: Secondary | ICD-10-CM

## 2021-08-10 DIAGNOSIS — Z01419 Encounter for gynecological examination (general) (routine) without abnormal findings: Secondary | ICD-10-CM | POA: Diagnosis not present

## 2021-08-10 DIAGNOSIS — E6609 Other obesity due to excess calories: Secondary | ICD-10-CM

## 2021-08-10 DIAGNOSIS — Z6830 Body mass index (BMI) 30.0-30.9, adult: Secondary | ICD-10-CM

## 2021-08-10 NOTE — Progress Notes (Signed)
[place] 1966-01-11 [id number]   History:    55 y.o.  G3P3L2 Married.  2 daughters, eldest has 1 child and youngest has 2 children.   RP:  Established patient presenting for annual gyn exam    HPI:  Menses normal every month.  No BTB.  H/O Simple Endometrial Hyperplasia without Atypia.  EBx repeated 07/2020 Disordered proliferative endometrium. No atypia or malignancy.  No pelvic pain.  No pain with IC.  Urine/BMs normal.  Breasts normal.  BMI 30.11.  Followed by Rheumato Dr Marella Chimes for Arthritis.  Health labs with Fam MD. Harriet Masson Neg 2021.   Past medical history,surgical history, family history and social history were all reviewed and documented in the EPIC chart.  Gynecologic History Patient's last menstrual period was 08/01/2021 (exact date).  Obstetric History OB History  Gravida Para Term Preterm AB Living  3 2 0 1   0  SAB IAB Ectopic Multiple Live Births  0 0 0 0      # Outcome Date GA Lbr Len/2nd Weight Sex Delivery Anes PTL Lv  3 Gravida           2 Para           1 Preterm              ROS: A ROS was performed and pertinent positives and negatives are included in the history.  GENERAL: No fevers or chills. HEENT: No change in vision, no earache, sore throat or sinus congestion. NECK: No pain or stiffness. CARDIOVASCULAR: No chest pain or pressure. No palpitations. PULMONARY: No shortness of breath, cough or wheeze. GASTROINTESTINAL: No abdominal pain, nausea, vomiting or diarrhea, melena or bright red blood per rectum. GENITOURINARY: No urinary frequency, urgency, hesitancy or dysuria. MUSCULOSKELETAL: No joint or muscle pain, no back pain, no recent trauma. DERMATOLOGIC: No rash, no itching, no lesions. ENDOCRINE: No polyuria, polydipsia, no heat or cold intolerance. No recent change in weight. HEMATOLOGICAL: No anemia or easy bruising or bleeding. NEUROLOGIC: No headache, seizures, numbness, tingling or weakness. PSYCHIATRIC: No depression, no loss of  interest in normal activity or change in sleep pattern.     Exam:   BP 134/74 (BP Location: Right Arm)   Pulse 80   Resp 20   Ht '5\' 2"'$  (1.575 m)   Wt 164 lb 9.6 oz (74.7 kg)   LMP 08/01/2021 (Exact Date)   BMI 30.11 kg/m   Body mass index is 30.11 kg/m.  General appearance : Well developed well nourished female. No acute distress HEENT: Eyes: no retinal hemorrhage or exudates,  Neck supple, trachea midline, no carotid bruits, no thyroidmegaly Lungs: Clear to auscultation, no rhonchi or wheezes, or rib retractions  Heart: Regular rate and rhythm, no murmurs or gallops Breast:Examined in sitting and supine position were symmetrical in appearance, no palpable masses or tenderness,  no skin retraction, no nipple inversion, no nipple discharge, no skin discoloration, no axillary or supraclavicular lymphadenopathy Abdomen: no palpable masses or tenderness, no rebound or guarding Extremities: no edema or skin discoloration or tenderness  Pelvic: Vulva: Normal             Vagina: No gross lesions or discharge  Cervix: No gross lesions or discharge  Uterus  AV, normal size, shape and consistency, non-tender and mobile  Adnexa  Without masses or tenderness  Anus: Normal   Assessment/Plan:  55 y.o. female for annual exam   1. Well female exam with routine gynecological exam Normal menstrual periods  every month.  No breakthrough bleeding.  History of simple hyperplasia of the endometrium without atypia.  Repeat endometrial biopsy August 2021 showed a benign disordered endometrium with no atypia or hyperplasia.  Pap test August 2021 was negative, no indication to repeat this year.  Breast exam normal.  Patient will schedule a screening mammogram at the breast center now.  Health labs with family physician.  Colonoscopy 2021.  2. Tubal ligation status  3. Class 1 obesity due to excess calories with serious comorbidity and body mass index (BMI) of 30.0 to 30.9 in adult  Lower calorie/carb  diet started by patient.  Aerobic activities 5 times a week and light weightlifting every 2 days.  [name] MD, 4:03 PM 08/10/2021

## 2021-09-09 ENCOUNTER — Encounter: Payer: Self-pay | Admitting: Family Medicine

## 2021-09-09 ENCOUNTER — Telehealth (INDEPENDENT_AMBULATORY_CARE_PROVIDER_SITE_OTHER): Payer: Self-pay | Admitting: Family Medicine

## 2021-09-09 VITALS — BP 130/79 | HR 80 | Ht 62.0 in

## 2021-09-09 DIAGNOSIS — R5381 Other malaise: Secondary | ICD-10-CM

## 2021-09-09 DIAGNOSIS — R110 Nausea: Secondary | ICD-10-CM

## 2021-09-09 DIAGNOSIS — N938 Other specified abnormal uterine and vaginal bleeding: Secondary | ICD-10-CM

## 2021-09-09 DIAGNOSIS — R5383 Other fatigue: Secondary | ICD-10-CM

## 2021-09-09 NOTE — Progress Notes (Signed)
Virtual Visit via Video Note I connected with Tracey Wood on 09/09/21 by a video enabled telemedicine application and verified that I am speaking with the correct person using two identifiers.  Location patient: home Location provider:work office Persons participating in the virtual visit: patient, provider  I discussed the limitations of evaluation and management by telemedicine and the availability of in person appointments. The patient expressed understanding and agreed to proceed.  Chief Complaint  Patient presents with   Nausea   pain in hips and elbow   HPI: Tracey Wood is a 55 yo female with hx of RA on [place],HTN c/o 13 days of intermittent fatigue and generalized body aches.  Episodes last up to 3 hours, she feels better in between She has not identified exacerbating or alleviating factors.  Negative for fever, chills, sore throat, nasal congestion, anosmia, ageusia, rhinorrhea, cough, dyspnea, palpitations, CP, or diaphoresis. No changes in appetite.  Her daughter and grandchildren were sick about 2 weeks ago. Grandchildren diagnosed with parainfluenza infection.  She has not identified exacerbating or alleviating factors. She has had episodes of nausea, exacerbated by food intake, has had about 2-3 episodes.  Negative for abdominal pain, vomiting, changes in bowel habits, urinary symptoms, or a skin rash.  She has Zofran tablets left from old prescription, taking a daily as needed.  She takes meloxicam 15 mg daily. Last appt with rheumatologist 2 months ago, blood work done.  She is also complaining of brownish vaginal bleeding intermittently for the past 2 months. Last normal menstrual period 2 months ago. Negative for pelvic pain or vaginal discharge.  ROS: See pertinent positives and negatives per HPI.  Past Medical History:  Diagnosis Date   Bronchitis    Cataract    had sx    Hypertension    Rheumatoid arthritis ([place])    Thyroid disease     UTI (urinary tract infection)    Past Surgical History:  Procedure Laterality Date   CATARACT EXTRACTION     R & L   TUBAL LIGATION     Family History  Problem Relation Age of Onset   Breast cancer Maternal Grandmother 55   Colon cancer Neg Hx    Colon polyps Neg Hx    Esophageal cancer Neg Hx    Rectal cancer Neg Hx    Stomach cancer Neg Hx    Social History   Socioeconomic History   Marital status: Married    Spouse name: Not on file   Number of children: Not on file   Years of education: Not on file   Highest education level: Not on file  Occupational History   Not on file  Tobacco Use   Smoking status: Never   Smokeless tobacco: Never  Vaping Use   Vaping Use: Never used  Substance and Sexual Activity   Alcohol use: No    Alcohol/week: 0.0 standard drinks   Drug use: No   Sexual activity: Yes    Partners: Male    Birth control/protection: Other-see comments    Comment: 1st intercourse- 85, partners- 2, married- 24 yrs   Other Topics Concern   Not on file  Social History Narrative   Right handed   One story   No caffeine x 2 weeks   Social Determinants of Radio broadcast assistant Strain: Not on file  Food Insecurity: Not on file  Transportation Needs: Not on file  Physical Activity: Not on file  Stress: Not on file  Social Connections: Not  on file  Intimate Partner Violence: Not on file    Current Outpatient Medications:    amLODipine-benazepril (LOTREL) 5-20 MG capsule, TAKE 1 CAPSULE BY MOUTH EVERY DAY, Disp: 90 capsule, Rfl: 2   meloxicam (MOBIC) 15 MG tablet, Take 15 mg by mouth daily., Disp: , Rfl:    ORENCIA CLICKJECT 0000000 MG/ML SOAJ, Inject 1 Syringe into the skin once a week., Disp: , Rfl:   EXAM:  VITALS per patient if applicable:BP [id number]   Pulse 80   Ht '5\' 2"'$  (1.575 m)   BMI 30.11 kg/m   GENERAL: alert, oriented, appears well and in no acute distress  HEENT: atraumatic, conjunctiva clear, no obvious abnormalities on inspection  of external nose and ears  NECK: normal movements of the head and neck  LUNGS: on inspection no signs of respiratory distress, breathing rate appears normal, no obvious gross SOB, gasping or wheezing  CV: no obvious cyanosis  MS: moves all visible extremities without noticeable abnormality  PSYCH/NEURO: pleasant and cooperative, no obvious depression or anxiety, speech and thought processing grossly intact  ASSESSMENT AND PLAN:  Discussed the following assessment and plan:  Malaise and fatigue We discussed possible etiologies. ? Viral synd. Recommend home COVID 19 test. Tylenol 500 mg every 6 hours of needed. Monitor for new symptoms. Plenty of po fluids. I do not think blood work is needed at this time but will consider if symptoms do not resolved in 5-7 days.  Nausea without vomiting Problem has been intermittent. Continue Zofran 4 mg daily prn. Monitor for new symptoms.  DUB (dysfunctional uterine bleeding) Most likely caused by perimenopause. We discussed treatment options, OCP's. She will discussed options with her gyn.  We discussed possible serious and likely etiologies, options for evaluation and workup, limitations of telemedicine visit vs in person visit, treatment, treatment risks and precautions.  I discussed the assessment and treatment plan with the patient. Tracey Wood was provided an opportunity to ask questions and all were answered. She agreed with the plan and demonstrated an understanding of the instructions.  Return if symptoms worsen or fail to improve.  Ezeriah Luty Martinique, MD

## 2021-10-14 ENCOUNTER — Encounter: Payer: Self-pay | Admitting: Family Medicine

## 2021-10-14 ENCOUNTER — Ambulatory Visit (INDEPENDENT_AMBULATORY_CARE_PROVIDER_SITE_OTHER): Payer: 59 | Admitting: Family Medicine

## 2021-10-14 ENCOUNTER — Other Ambulatory Visit: Payer: Self-pay

## 2021-10-14 VITALS — BP 120/70 | HR 80 | Resp 16 | Ht 62.0 in | Wt 161.0 lb

## 2021-10-14 DIAGNOSIS — R739 Hyperglycemia, unspecified: Secondary | ICD-10-CM

## 2021-10-14 DIAGNOSIS — I10 Essential (primary) hypertension: Secondary | ICD-10-CM

## 2021-10-14 DIAGNOSIS — H8113 Benign paroxysmal vertigo, bilateral: Secondary | ICD-10-CM | POA: Diagnosis not present

## 2021-10-14 LAB — POCT GLYCOSYLATED HEMOGLOBIN (HGB A1C): HbA1c, POC (prediabetic range): 5.9 % (ref 5.7–6.4)

## 2021-10-14 MED ORDER — MECLIZINE HCL 25 MG PO TABS: 25.0000 mg | ORAL_TABLET | Freq: Three times a day (TID) | ORAL | 0 refills | Status: DC | PRN

## 2021-10-14 NOTE — Progress Notes (Signed)
Chief Complaint  Patient presents with   Dizziness    Started last week, but was sporadic. Yesterday, it was constant. Had a hot flash, face was red, and nauseous. Bp reading yesterday was 160/101.   HPI: Tracey Wood is a 55 y.o. female, who is here today complaining of almost two weeks of dizziness. Sudden , feels like floor is moving. Last week very sporadic but yesterday was more frequent. Hot flash, feeling face flushing. Yesterday episode was associated with severe nausea.  Spinning sensation: Yes Duration: Seconds. Frequency: Intermittently Prior episodes: About 20 years ago she had dizziness, walking towards the sides, needed to hold walls. Exacerbated by Movement, it happens when lying in bed and with eyes closed. Alleviated by being still.  Negative for associated headache, visual changes, chest pain,dyspnea, palpitation, or syncope. Negative for hearing loss, tinnitus,recent URI or travel.  She follows with rheumatologist regularly and had blood a few months ago. She is concerned about elevated glucose. No hx of diabetes. Negative for polydipsia,polyuria, or polyphagia.  She has not tried OTC medication.  HTN: She is on Amlodipine-Benazepril 5-20 mg daily. Negative for focal weakness or edema. BP was elevated yesterday during episode of dizziness.  Review of Systems  Constitutional:  Positive for activity change and fatigue. Negative for appetite change and fever.  HENT:  Negative for congestion, mouth sores, nosebleeds, rhinorrhea, sinus pressure and sore throat.   Respiratory:  Negative for cough and wheezing.   Gastrointestinal:  Negative for abdominal pain and vomiting.       Negative for changes in bowel habits.  Genitourinary:  Negative for decreased urine volume, dysuria and hematuria.  Neurological:  Negative for tremors and facial asymmetry.  Hematological:  Negative for adenopathy. Does not bruise/bleed easily.  Psychiatric/Behavioral:   Negative for confusion. The patient is not nervous/anxious.   Rest see pertinent positives and negatives per HPI.  Current Outpatient Medications on File Prior to Visit  Medication Sig Dispense Refill   amLODipine-benazepril (LOTREL) 5-20 MG capsule TAKE 1 CAPSULE BY MOUTH EVERY DAY 90 capsule 2   meloxicam (MOBIC) 15 MG tablet Take 15 mg by mouth daily.     ORENCIA CLICKJECT 716 MG/ML SOAJ Inject 1 Syringe into the skin once a week.     No current facility-administered medications on file prior to visit.   Past Medical History:  Diagnosis Date   Bronchitis    Cataract    had sx    Hypertension    Rheumatoid arthritis ([place])    Thyroid disease    UTI (urinary tract infection)    Drug Allergies:  Allergies  Allergen Reactions   Morphine And Related Other (See Comments)    Fast heart beat   Ciprofloxacin Rash   Oxycodone Palpitations   Tape Dermatitis   Social History   Socioeconomic History   Marital status: Married    Spouse name: Not on file   Number of children: Not on file   Years of education: Not on file   Highest education level: Not on file  Occupational History   Not on file  Tobacco Use   Smoking status: Never   Smokeless tobacco: Never  Vaping Use   Vaping Use: Never used  Substance and Sexual Activity   Alcohol use: No    Alcohol/week: 0.0 standard drinks   Drug use: No   Sexual activity: Yes    Partners: Male    Birth control/protection: Other-see comments    Comment: 1st intercourse- 60, partners-  2, married- 12 yrs   Other Topics Concern   Not on file  Social History Narrative   Right handed   One story   No caffeine x 2 weeks   Social Determinants of Health   Financial Resource Strain: Not on file  Food Insecurity: Not on file  Transportation Needs: Not on file  Physical Activity: Not on file  Stress: Not on file  Social Connections: Not on file   Vitals:   10/14/21 0919  BP: 120/70  Pulse: 80  Resp: 16  SpO2: 97%   Body mass  index is 29.45 kg/m.  Physical Exam Vitals and nursing note reviewed.  Constitutional:      General: She is not in acute distress.    Appearance: She is well-developed.  HENT:     Head: Normocephalic and atraumatic.     Right Ear: Tympanic membrane, ear canal and external ear normal. No decreased hearing noted.     Left Ear: Tympanic membrane, ear canal and external ear normal. No decreased hearing noted.     Ears:     Comments: Apley maneuver and Dix-Hallpike maneuver positive, L>>R. + Nystagmus.     Mouth/Throat:     Mouth: Mucous membranes are moist.     Pharynx: Oropharynx is clear.  Eyes:     Extraocular Movements: Extraocular movements intact.     Conjunctiva/sclera: Conjunctivae normal.  Neck:     Vascular: No carotid bruit.  Cardiovascular:     Rate and Rhythm: Normal rate and regular rhythm.     Pulses:          Dorsalis pedis pulses are 2+ on the right side and 2+ on the left side.     Heart sounds: No murmur heard. Pulmonary:     Effort: Pulmonary effort is normal. No respiratory distress.     Breath sounds: Normal breath sounds.  Abdominal:     Palpations: Abdomen is soft. There is no hepatomegaly or mass.     Tenderness: There is no abdominal tenderness.  Lymphadenopathy:     Cervical: No cervical adenopathy.  Skin:    General: Skin is warm.     Findings: No erythema or rash.  Neurological:     General: No focal deficit present.     Mental Status: She is alert and oriented to person, place, and time.     Cranial Nerves: No cranial nerve deficit.     Motor: No tremor or pronator drift.     Gait: Gait normal.     Deep Tendon Reflexes:     Reflex Scores:      Bicep reflexes are 2+ on the right side and 2+ on the left side.      Patellar reflexes are 2+ on the right side and 2+ on the left side. Psychiatric:     Comments: Well groomed, good eye contact.   ASSESSMENT AND PLAN:  Tracey Wood was seen today for dizziness.  Diagnoses and all orders for this  visit: Orders Placed This Encounter  Procedures   POC HgB A1c   Lab Results  Component Value Date   HGBA1C 5.9 10/14/2021   Benign paroxysmal positional vertigo due to bilateral vestibular disorder We discussed other possible etiologies of dizziness, Hx and examination today suggest benign vertigo. I do not think further work-up is necessary at this time but needs to be consider if worsening or persient symptoms for more that 2 weeks; in this case ENT can be considered. Explained that problem can be  recurrent. Fall prevention. Vestibular exercises recommended, handout with [place] maneuvers given. We can consider PT referral if she has difficulty doing exercises at home. Meclizine 25 mg tid prn, some side effects discussed. Instructed about warning signs. F/U as needed.  -     meclizine (ANTIVERT) 25 MG tablet; Take 1 tablet (25 mg total) by mouth 3 (three) times daily as needed for dizziness.  Hypertension, essential, benign BP adequately controlled today. Continue monitoring BP regularly. Continue Amlodipine-Benazepril 5-20 mg daily.  Hyperglycemia HgA1C mildly elevated, prediabetes. A healthy life style for diabetes prevention encouraged. We will continue monitoring regularly.  Return if symptoms worsen or fail to improve.  Tikita Mabee G. Martinique, MD  [facility]. [place] office.

## 2021-10-14 NOTE — Patient Instructions (Addendum)
A few things to remember from today's visit:  Meclizine 3 veces at dia por 3 dias. Si no esta mejor en una semana podemos empezar therapia.  Vrtigo posicional benigno  El vrtigo es la sensacin de que usted o todo lo que lo rodea se mueve cuando en realidad eso no sucede. El vrtigo posicional benigno es el tipo de vrtigo ms comn. Generalmente se trata de una enfermedad no daina (benigna). Esta afeccin es posicional. Esto significa que los sntomas son desencadenados por ciertos movimientos y posiciones. Esta afeccin puede ser peligrosa si ocurre mientras est haciendo algo que podra suponer un riesgo para usted o para los dems. Incluye actividades como conducir u operar [place]. Cules son las causas? El odo interno tiene canales llenos de lquido que ayudan a que el cerebro perciba el movimiento y el equilibrio. Cuando el lquido se mueve, el cerebro recibe mensajes sobre la posicin del cuerpo. En el vrtigo posicional benigno, los cristales de calcio que estn en el odo interno se desprenden y alteran la zona del odo interno. Esto hace que el cerebro reciba mensajes confusos sobre la posicin del cuerpo. Qu incrementa el riesgo? Es ms probable que sufra esta afeccin si: Es mujer. Es mayor de 12 aos. Ha sufrido una lesin en la cabeza recientemente. Tiene una enfermedad en el odo interno. Cules son los signos o sntomas? Generalmente, los sntomas de este trastorno se presentan al mover la cabeza o los ojos en diferentes direcciones. Los sntomas pueden aparecer repentinamente y suelen durar menos de un minuto. Incluyen los siguientes: Prdida del equilibrio y cadas. Sensacin de estar dando vueltas o movindose. Sensacin de que el entorno est dando vueltas o movindose. Nuseas y vmitos. Visin borrosa. Mareos. Movimientos oculares involuntarios (nistagmo). Los sntomas pueden ser leves y solo causar problemas menores, o pueden ser graves e interferir en la  vida cotidiana. Los episodios de vrtigo posicional benigno pueden repetirse (volver a Arts administrator) con el transcurso del tiempo. Los sntomas tambin pueden mejorar con [employer]. Cmo se diagnostica? Esta afeccin se puede diagnosticar en funcin de lo siguiente: Sus antecedentes mdicos. Un examen fsico de la cabeza, el cuello y los odos. Pruebas de posicin para detectar o estimular el vrtigo. Es posible que le pidan que gire la cabeza y [place] de posicin, como pasar de estar sentado a Acupuncturist. El mdico estar pendiente por si aparecen sntomas de vrtigo. Tal vez lo deriven a [place] en problemas de la garganta, la nariz y el odo (otorrinolaringlogo), o a uno que se especializa en trastornos del sistema nervioso (neurlogo). Cmo se trata? Esta afeccin se puede tratar en una sesin en la cual el mdico le pondr la cabeza en posiciones especficas para ayudar a que los cristales desplazados del odo interno se [place]. El tratamiento de esta afeccin puede llevar varias sesiones. [employer] casos son graves, tal vez haya que realizar una ciruga, pero esto no es frecuente. En algunos casos, el vrtigo posicional benigno se resuelve por s solo en el trmino de 2 o 4 semanas. Siga estas instrucciones en su casa: Seguridad Muvase lentamente. Evite algunas posiciones o determinados movimientos repentinos de la cabeza y el cuerpo como se lo haya indicado el mdico. Evite conducir y Administrator, arts maquinaria hasta que el mdico le indique que es seguro [place]. No haga ninguna tarea que podra ser peligrosa para usted o para Producer, television/film/video en caso de vrtigo. Si tiene dificultad para caminar o mantener el equilibrio, use un bastn para Consulting civil engineer estabilidad.  Si se siente mareado o inestable, sintese de inmediato. Retome sus actividades normales segn lo indicado por el mdico. Pregntele al mdico qu actividades son seguras para usted. Instrucciones generales Use los  medicamentos de venta libre y los recetados solamente como se lo haya indicado el mdico. Beba suficiente lquido como para Theatre manager la orina de color amarillo plido. Concurra a [place]. Esto es importante. Comunquese con un mdico si: Tiene fiebre. Su afeccin empeora o presenta sntomas nuevos. Sus familiares o amigos advierten cambios en su comportamiento. Tiene nuseas o vmitos que empeoran. Tiene adormecimiento o sensacin de pinchazos y hormigueo. Busque ayuda de inmediato si: Tiene dificultad para hablar o para moverse. Siempre est mareado o se desmaya. Presenta dolores de cabeza intensos. Tiene debilidad en los brazos o las piernas. Presenta cambios en la audicin o la visin. Presenta rigidez en el cuello. Presenta sensibilidad a la luz. Estos sntomas pueden representar un problema grave que constituye Engineer, maintenance (IT). No espere a ver si los sntomas desaparecen. Solicite atencin mdica de inmediato. Comunquese con el servicio de emergencias de su localidad (911 en los [place]). No conduzca por sus propios medios [employer]. Resumen El vrtigo es la sensacin de que usted o todo lo que lo rodea se mueve cuando en realidad eso no sucede. El vrtigo posicional benigno es el tipo de vrtigo ms comn. La causa de esta afeccin es el desplazamiento de los cristales de calcio del odo interno. Esto produce una alteracin en una zona del odo interno que ayuda al cerebro a percibir el movimiento y el equilibrio. Los sntomas incluyen prdida del equilibrio y cadas, sensacin de que usted o su entorno se mueve, nuseas y vmitos, y visin borrosa. Esta afeccin se puede diagnosticar en funcin de los sntomas, un examen fsico y pruebas de posicionamiento. Siga las instrucciones de seguridad que le haya dado el mdico y vaya a todas las visitas de seguimiento. Esto es importante. Esta informacin no tiene Marine scientist el consejo del  mdico. Asegrese de hacerle al mdico cualquier pregunta que tenga. Document Revised: 12/08/2020 Document Reviewed: 12/08/2020 Elsevier Patient Education  2022 [place].  Benign paroxysmal positional vertigo due to bilateral vestibular disorder - Plan: meclizine (ANTIVERT) 25 MG tablet  Hypertension, essential, benign  If you need refills please call your pharmacy. Do not use My Chart to request refills or for acute issues that need immediate attention.    Please be sure medication list is accurate. If a new problem present, please set up appointment sooner than planned today.

## 2021-10-18 ENCOUNTER — Encounter: Payer: Self-pay | Admitting: Family Medicine

## 2021-11-18 ENCOUNTER — Other Ambulatory Visit: Payer: Self-pay | Admitting: Family Medicine

## 2021-11-18 DIAGNOSIS — I10 Essential (primary) hypertension: Secondary | ICD-10-CM

## 2021-11-27 ENCOUNTER — Telehealth: Payer: Self-pay

## 2021-11-27 NOTE — Telephone Encounter (Signed)
Caller states they'd like to make an appt or receive a prescription. Cough for 2 weeks and cough syrup and other OTC is not getting rid of it. Pain when coughing in chest. Caller states she's had a cough for 2 weeks. Taking cough syrup and other OTC medications, but they are not helping. Pain to chest when coughing. Pain to chest all the time. States it feels pressure to chest and constant. Chest pressure started on Monday. No fever.   11/26/2021 4:31:23 PM Go to ED Now Phoebe Perch, RN, [place] Understands Yes  Comments User: Zenaida Deed, RN Date/Time (Eastern Time): 11/26/2021 4:27:01 PM States chest pressure is the same as when she had bronchitis in the past.  User: Zenaida Deed, RN Date/Time (Eastern Time): 11/26/2021 4:31:39 PM Caller refused to go to ED. Stated she will go to UC now. Going to [facility].  Referrals GO TO FACILITY OTHER - SPECIFY  11/27/21 1544: Pt states she is going to [facility] today. Advised to f/u with Dr Martinique as needed. Pt verb understanding & has no ques/concerns at this time.

## 2022-01-07 ENCOUNTER — Encounter: Payer: Self-pay | Admitting: Family Medicine

## 2022-01-25 ENCOUNTER — Encounter (HOSPITAL_BASED_OUTPATIENT_CLINIC_OR_DEPARTMENT_OTHER): Payer: Self-pay | Admitting: Urology

## 2022-01-25 ENCOUNTER — Emergency Department (HOSPITAL_BASED_OUTPATIENT_CLINIC_OR_DEPARTMENT_OTHER): Payer: 59 | Admitting: Radiology

## 2022-01-25 ENCOUNTER — Emergency Department (HOSPITAL_BASED_OUTPATIENT_CLINIC_OR_DEPARTMENT_OTHER)
Admission: EM | Admit: 2022-01-25 | Discharge: 2022-01-25 | Disposition: A | Discharge status: Home / Self Care | Payer: 59 | Attending: Emergency Medicine | Admitting: Emergency Medicine

## 2022-01-25 ENCOUNTER — Other Ambulatory Visit: Payer: Self-pay

## 2022-01-25 DIAGNOSIS — M546 Pain in thoracic spine: Secondary | ICD-10-CM | POA: Insufficient documentation

## 2022-01-25 DIAGNOSIS — M545 Low back pain, unspecified: Secondary | ICD-10-CM | POA: Insufficient documentation

## 2022-01-25 DIAGNOSIS — M25521 Pain in right elbow: Secondary | ICD-10-CM | POA: Diagnosis not present

## 2022-01-25 DIAGNOSIS — M7918 Myalgia, other site: Secondary | ICD-10-CM

## 2022-01-25 DIAGNOSIS — Y92410 Unspecified street and highway as the place of occurrence of the external cause: Secondary | ICD-10-CM | POA: Diagnosis not present

## 2022-01-25 DIAGNOSIS — M542 Cervicalgia: Secondary | ICD-10-CM | POA: Diagnosis not present

## 2022-01-25 MED ORDER — METHOCARBAMOL 500 MG PO TABS: 1000.0000 mg | ORAL_TABLET | Freq: Four times a day (QID) | ORAL | 0 refills | Status: DC
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
## 2022-01-28 ENCOUNTER — Encounter: Payer: Self-pay | Admitting: Family Medicine

## 2022-03-01 ENCOUNTER — Encounter: Payer: Self-pay | Admitting: Family Medicine
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
## 2022-08-11 ENCOUNTER — Ambulatory Visit: Payer: 59 | Admitting: Obstetrics & Gynecology

## 2022-08-29 ENCOUNTER — Other Ambulatory Visit: Payer: Self-pay

## 2022-08-29 ENCOUNTER — Inpatient Hospital Stay (HOSPITAL_BASED_OUTPATIENT_CLINIC_OR_DEPARTMENT_OTHER)
Admission: EM | Admit: 2022-08-29 | Discharge: 2022-09-01 | DRG: 872 | Disposition: A | Discharge status: Home / Self Care | Payer: 59 | Attending: Internal Medicine | Admitting: Internal Medicine

## 2022-08-29 ENCOUNTER — Encounter (HOSPITAL_COMMUNITY): Payer: Self-pay

## 2022-08-29 ENCOUNTER — Encounter (HOSPITAL_BASED_OUTPATIENT_CLINIC_OR_DEPARTMENT_OTHER): Payer: Self-pay

## 2022-08-29 ENCOUNTER — Emergency Department (HOSPITAL_BASED_OUTPATIENT_CLINIC_OR_DEPARTMENT_OTHER): Payer: 59 | Admitting: Radiology

## 2022-08-29 ENCOUNTER — Emergency Department (HOSPITAL_BASED_OUTPATIENT_CLINIC_OR_DEPARTMENT_OTHER): Payer: 59

## 2022-08-29 DIAGNOSIS — M0579 Rheumatoid arthritis with rheumatoid factor of multiple sites without organ or systems involvement: Secondary | ICD-10-CM | POA: Diagnosis present

## 2022-08-29 DIAGNOSIS — I10 Essential (primary) hypertension: Secondary | ICD-10-CM | POA: Diagnosis present

## 2022-08-29 DIAGNOSIS — Z79899 Other long term (current) drug therapy: Secondary | ICD-10-CM | POA: Diagnosis not present

## 2022-08-29 DIAGNOSIS — R0789 Other chest pain: Secondary | ICD-10-CM | POA: Diagnosis not present

## 2022-08-29 DIAGNOSIS — Z803 Family history of malignant neoplasm of breast: Secondary | ICD-10-CM

## 2022-08-29 DIAGNOSIS — K760 Fatty (change of) liver, not elsewhere classified: Secondary | ICD-10-CM | POA: Diagnosis present

## 2022-08-29 DIAGNOSIS — N136 Pyonephrosis: Secondary | ICD-10-CM | POA: Diagnosis present

## 2022-08-29 DIAGNOSIS — Z791 Long term (current) use of non-steroidal anti-inflammatories (NSAID): Secondary | ICD-10-CM | POA: Diagnosis not present

## 2022-08-29 DIAGNOSIS — Z888 Allergy status to other drugs, medicaments and biological substances status: Secondary | ICD-10-CM

## 2022-08-29 DIAGNOSIS — Z885 Allergy status to narcotic agent status: Secondary | ICD-10-CM

## 2022-08-29 DIAGNOSIS — N12 Tubulo-interstitial nephritis, not specified as acute or chronic: Secondary | ICD-10-CM | POA: Diagnosis present

## 2022-08-29 DIAGNOSIS — R9431 Abnormal electrocardiogram [ECG] [EKG]: Secondary | ICD-10-CM | POA: Diagnosis present

## 2022-08-29 DIAGNOSIS — I440 Atrioventricular block, first degree: Secondary | ICD-10-CM | POA: Diagnosis not present

## 2022-08-29 DIAGNOSIS — R0600 Dyspnea, unspecified: Secondary | ICD-10-CM | POA: Diagnosis present

## 2022-08-29 DIAGNOSIS — R059 Cough, unspecified: Secondary | ICD-10-CM | POA: Diagnosis not present

## 2022-08-29 DIAGNOSIS — A419 Sepsis, unspecified organism: Secondary | ICD-10-CM | POA: Diagnosis not present

## 2022-08-29 DIAGNOSIS — K8020 Calculus of gallbladder without cholecystitis without obstruction: Secondary | ICD-10-CM | POA: Diagnosis not present

## 2022-08-29 DIAGNOSIS — D649 Anemia, unspecified: Secondary | ICD-10-CM | POA: Diagnosis not present

## 2022-08-29 DIAGNOSIS — R079 Chest pain, unspecified: Secondary | ICD-10-CM | POA: Diagnosis present

## 2022-08-29 DIAGNOSIS — N10 Acute tubulo-interstitial nephritis: Secondary | ICD-10-CM | POA: Diagnosis present

## 2022-08-29 DIAGNOSIS — E876 Hypokalemia: Secondary | ICD-10-CM | POA: Diagnosis not present

## 2022-08-29 LAB — CBC WITH DIFFERENTIAL/PLATELET
Abs Immature Granulocytes: 0.07 10*3/uL (ref 0.00–0.07)
Basophils Absolute: 0 10*3/uL (ref 0.0–0.1)
Basophils Relative: 0 %
Eosinophils Absolute: 0 10*3/uL (ref 0.0–0.5)
Eosinophils Relative: 0 %
HCT: 37.4 % (ref 36.0–46.0)
Hemoglobin: 12.5 g/dL (ref 12.0–15.0)
Immature Granulocytes: 1 %
Lymphocytes Relative: 7 %
Lymphs Abs: 0.9 10*3/uL (ref 0.7–4.0)
MCH: 29 pg (ref 26.0–34.0)
MCHC: 33.4 g/dL (ref 30.0–36.0)
MCV: 86.8 fL (ref 80.0–100.0)
Monocytes Absolute: 0.6 10*3/uL (ref 0.1–1.0)
Monocytes Relative: 5 %
Neutro Abs: 10.7 10*3/uL — ABNORMAL HIGH (ref 1.7–7.7)
Neutrophils Relative %: 87 %
Platelets: 246 10*3/uL (ref 150–400)
RBC: 4.31 MIL/uL (ref 3.87–5.11)
RDW: 13.4 % (ref 11.5–15.5)
WBC: 12.2 10*3/uL — ABNORMAL HIGH (ref 4.0–10.5)
nRBC: 0 % (ref 0.0–0.2)

## 2022-08-29 LAB — URINALYSIS, ROUTINE W REFLEX MICROSCOPIC
Bilirubin Urine: NEGATIVE
Glucose, UA: NEGATIVE mg/dL
Ketones, ur: 40 mg/dL — AB
Nitrite: POSITIVE — AB
Protein, ur: 100 mg/dL — AB
Specific Gravity, Urine: 1.015 (ref 1.005–1.030)
pH: 5.5 (ref 5.0–8.0)

## 2022-08-29 LAB — COMPREHENSIVE METABOLIC PANEL
ALT: 25 U/L (ref 0–44)
AST: 17 U/L (ref 15–41)
Albumin: 4.2 g/dL (ref 3.5–5.0)
Alkaline Phosphatase: 83 U/L (ref 38–126)
Anion gap: 14 (ref 5–15)
BUN: 9 mg/dL (ref 6–20)
CO2: 20 mmol/L — ABNORMAL LOW (ref 22–32)
Calcium: 8.9 mg/dL (ref 8.9–10.3)
Chloride: 98 mmol/L (ref 98–111)
Creatinine, Ser: 0.69 mg/dL (ref 0.44–1.00)
GFR, Estimated: >60 mL/min (ref >60)
Glucose, Bld: 150 mg/dL — ABNORMAL HIGH (ref 70–99)
Potassium: 3.1 mmol/L — ABNORMAL LOW (ref 3.5–5.1)
Sodium: 132 mmol/L — ABNORMAL LOW (ref 135–145)
Total Bilirubin: 1.1 mg/dL (ref 0.3–1.2)
Total Protein: 7.8 g/dL (ref 6.5–8.1)

## 2022-08-29 LAB — LACTIC ACID, PLASMA: Lactic Acid, Venous: 1.4 mmol/L (ref 0.5–1.9)

## 2022-08-29 LAB — APTT: aPTT: 30 seconds (ref 24–36)

## 2022-08-29 LAB — PROTIME-INR
INR: 1 (ref 0.8–1.2)
Prothrombin Time: 13.4 seconds (ref 11.4–15.2)

## 2022-08-29 LAB — MAGNESIUM: Magnesium: 1.5 mg/dL — ABNORMAL LOW (ref 1.7–2.4)

## 2022-08-29 LAB — PREGNANCY, URINE: Preg Test, Ur: NEGATIVE

## 2022-08-29 LAB — TROPONIN I (HIGH SENSITIVITY): Troponin I (High Sensitivity): 4 ng/L (ref <18)

## 2022-08-29 MED ORDER — SODIUM CHLORIDE 0.9 % IV SOLN
2.0000 g | INTRAVENOUS | Status: DC
  Administered 2022-08-30 – 2022-08-31 (×2): 2 g via INTRAVENOUS
  Filled 2022-08-29 (×2): qty 20

## 2022-08-29 MED ORDER — POTASSIUM CHLORIDE 10 MEQ/100ML IV SOLN
10.0000 meq | INTRAVENOUS | Status: AC
  Administered 2022-08-29 (×2): 10 meq via INTRAVENOUS
  Filled 2022-08-29 (×2): qty 100

## 2022-08-29 MED ORDER — ACETAMINOPHEN 325 MG PO TABS
650.0000 mg | ORAL_TABLET | Freq: Four times a day (QID) | ORAL | Status: DC | PRN
  Administered 2022-08-30 (×2): 650 mg via ORAL
  Filled 2022-08-29 (×2): qty 2

## 2022-08-29 MED ORDER — LACTATED RINGERS IV BOLUS
1000.0000 mL | Freq: Once | INTRAVENOUS | Status: AC
  Administered 2022-08-29: 1000 mL via INTRAVENOUS

## 2022-08-29 MED ORDER — HYDROCODONE-ACETAMINOPHEN 5-325 MG PO TABS
1.0000 | ORAL_TABLET | ORAL | Status: DC | PRN
  Administered 2022-08-29 – 2022-08-30 (×2): 2 via ORAL
  Administered 2022-08-31: 1 via ORAL
  Filled 2022-08-29: qty 2
  Filled 2022-08-29: qty 1
  Filled 2022-08-29 (×2): qty 2

## 2022-08-29 MED ORDER — ACETAMINOPHEN 650 MG RE SUPP: 650.0000 mg | Freq: Four times a day (QID) | RECTAL | Status: DC | PRN

## 2022-08-29 MED ORDER — HYDROMORPHONE HCL 1 MG/ML IJ SOLN
1.0000 mg | Freq: Once | INTRAMUSCULAR | Status: AC
  Administered 2022-08-29: 1 mg via INTRAVENOUS
  Filled 2022-08-29: qty 1

## 2022-08-29 MED ORDER — ACETAMINOPHEN 325 MG PO TABS
650.0000 mg | ORAL_TABLET | Freq: Once | ORAL | Status: AC | PRN
  Administered 2022-08-29: 650 mg via ORAL
  Filled 2022-08-29: qty 2

## 2022-08-29 MED ORDER — ONDANSETRON HCL 4 MG/2ML IJ SOLN
4.0000 mg | Freq: Once | INTRAMUSCULAR | Status: AC
  Administered 2022-08-29: 4 mg via INTRAVENOUS
  Filled 2022-08-29: qty 2

## 2022-08-29 MED ORDER — SODIUM CHLORIDE 0.9 % IV SOLN
1.0000 g | Freq: Once | INTRAVENOUS | Status: AC
  Administered 2022-08-29: 1 g via INTRAVENOUS
  Filled 2022-08-29: qty 10

## 2022-08-29 MED ORDER — METHOCARBAMOL 500 MG PO TABS
1000.0000 mg | ORAL_TABLET | Freq: Four times a day (QID) | ORAL | Status: DC
  Administered 2022-08-29 – 2022-09-01 (×10): 1000 mg via ORAL
  Filled 2022-08-29 (×10): qty 2

## 2022-08-29 MED ORDER — IBUPROFEN 800 MG PO TABS
800.0000 mg | ORAL_TABLET | Freq: Once | ORAL | Status: AC
  Administered 2022-08-29: 800 mg via ORAL
  Filled 2022-08-29: qty 1

## 2022-08-29 MED ORDER — SODIUM CHLORIDE 0.9 % IV SOLN: INTRAVENOUS | Status: AC

## 2022-08-29 MED ORDER — MAGNESIUM SULFATE 2 GM/50ML IV SOLN
2.0000 g | Freq: Once | INTRAVENOUS | Status: AC
  Administered 2022-08-29: 2 g via INTRAVENOUS

## 2022-08-29 MED ORDER — SODIUM CHLORIDE 0.9 % IV SOLN: Freq: Once | INTRAVENOUS | Status: AC

## 2022-08-29 MED ORDER — FENTANYL CITRATE PF 50 MCG/ML IJ SOSY
12.5000 ug | PREFILLED_SYRINGE | INTRAMUSCULAR | Status: DC | PRN
  Administered 2022-08-30 (×2): 25 ug via INTRAVENOUS
  Administered 2022-08-31: 50 ug via INTRAVENOUS
  Filled 2022-08-29 (×2): qty 1

## 2022-08-29 NOTE — Plan of Care (Signed)
[facility] will assume care on arrival to accepting facility. Until arrival, care as per EDP. However, [facility] available 24/7 for questions and assistance.  Nursing staff, please page [facility] Admits and Consults ([phone]) as soon as the patient arrives to the hospital.   

## 2022-08-29 NOTE — ED Notes (Signed)
Went into room to give Pt Zofran and Pt had vomited x1.

## 2022-08-29 NOTE — ED Provider Notes (Signed)
[facility] EMERGENCY DEPT Provider Note   CSN: [id number] Arrival date & time: 08/29/22  1528     History  Chief Complaint  Patient presents with   Urinary Tract Infection    Tracey Wood is a 55 y.o. female.   Urinary Tract Infection    Patient presents due to dysuria.  Started 5 days ago, she denies any hematuria but is also having increased urinary frequency.  Starting today patient has been unable to tolerate anything by mouth without multiple episodes of emesis.  The pain is also moved up her right flank, it is constant.  She has been having fevers and chills which started yesterday.  Denies hematuria, she has been trying Azo without any relief of her symptoms.  Home Medications Prior to Admission medications   Medication Sig Start Date End Date Taking? Authorizing Provider  amLODipine-benazepril (LOTREL) 5-20 MG capsule TAKE 1 CAPSULE BY MOUTH EVERY DAY 11/18/21   [place], Betty G, MD  meclizine (ANTIVERT) 25 MG tablet Take 1 tablet (25 mg total) by mouth 3 (three) times daily as needed for dizziness. 10/14/21   [place], Betty G, MD  meloxicam (MOBIC) 15 MG tablet Take 15 mg by mouth daily. 08/27/19   [provider]  methocarbamol (ROBAXIN) 500 MG tablet Take 2 tablets (1,000 mg total) by mouth 4 (four) times daily. 01/25/22   Carlisle Cater, PA-C  ORENCIA CLICKJECT 017 MG/ML SOAJ Inject 1 Syringe into the skin once a week. 05/06/20   [provider]      Allergies    Morphine and related, Ciprofloxacin, Oxycodone, and Tape    Review of Systems   Review of Systems  Physical Exam Updated Vital Signs BP 138/77   Pulse 93   Temp 99.9 F (37.7 C)   Resp 16   Ht '5\' 2"'$  (1.575 m)   Wt 72.6 kg   SpO2 93%   BMI 29.26 kg/m  Physical Exam Vitals and nursing note reviewed. Exam conducted with a chaperone present.  Constitutional:      Appearance: Normal appearance. She is ill-appearing.  HENT:     Head: Normocephalic and  atraumatic.  Eyes:     General: No scleral icterus.       Right eye: No discharge.        Left eye: No discharge.     Extraocular Movements: Extraocular movements intact.     Pupils: Pupils are equal, round, and reactive to light.  Cardiovascular:     Rate and Rhythm: Regular rhythm. Tachycardia present.     Pulses: Normal pulses.     Heart sounds: Normal heart sounds. No murmur heard.    No friction rub. No gallop.  Pulmonary:     Effort: Pulmonary effort is normal. No respiratory distress.     Breath sounds: Normal breath sounds.  Abdominal:     General: Abdomen is flat. Bowel sounds are normal. There is no distension.     Palpations: Abdomen is soft.     Tenderness: There is no abdominal tenderness. There is right CVA tenderness.  Skin:    General: Skin is warm and dry.     Coloration: Skin is not jaundiced.  Neurological:     Mental Status: She is alert. Mental status is at baseline.     Coordination: Coordination normal.     ED Results / Procedures / Treatments   Labs (all labs ordered are listed, but only abnormal results are displayed) Labs Reviewed  COMPREHENSIVE METABOLIC PANEL - Abnormal;  Notable for the following components:      Result Value   Sodium 132 (*)    Potassium 3.1 (*)    CO2 20 (*)    Glucose, Bld 150 (*)    All other components within normal limits  CBC WITH DIFFERENTIAL/PLATELET - Abnormal; Notable for the following components:   WBC 12.2 (*)    Neutro Abs 10.7 (*)    All other components within normal limits  URINALYSIS, ROUTINE W REFLEX MICROSCOPIC - Abnormal; Notable for the following components:   APPearance HAZY (*)    Hgb urine dipstick MODERATE (*)    Ketones, ur 40 (*)    Protein, ur 100 (*)    Nitrite POSITIVE (*)    Leukocytes,Ua LARGE (*)    Bacteria, UA MANY (*)    All other components within normal limits  MAGNESIUM - Abnormal; Notable for the following components:   Magnesium 1.5 (*)    All other components within normal  limits  CULTURE, BLOOD (ROUTINE X 2)  CULTURE, BLOOD (ROUTINE X 2)  LACTIC ACID, PLASMA  PROTIME-INR  PREGNANCY, URINE  TROPONIN I (HIGH SENSITIVITY)    EKG EKG Interpretation  Date/Time:  Sunday August 29 2022 16:34:49 EDT Ventricular Rate:  136 PR Interval:  160 QRS Duration: 88 QT Interval:  378 QTC Calculation: 568 R Axis:   109 Text Interpretation: Sinus tachycardia Rightward axis Borderline ECG QT long no prior Confirmed by Aletta Edouard [phone]) on 08/29/2022 5:07:29 PM  Radiology CT Renal Stone Study  Result Date: 08/29/2022 CLINICAL DATA:  Flank pain, kidney stone suspected EXAM: CT ABDOMEN AND PELVIS WITHOUT CONTRAST TECHNIQUE: Multidetector CT imaging of the abdomen and pelvis was performed following the standard protocol without IV contrast. RADIATION DOSE REDUCTION: This exam was performed according to the departmental dose-optimization program which includes automated exposure control, adjustment of the mA and/or kV according to patient size and/or use of iterative reconstruction technique. COMPARISON:  CT 06/22/2021 FINDINGS: Lower chest: No acute airspace disease.  No pleural effusion. Hepatobiliary: The liver is enlarged spanning 20.4 cm cranial caudal with advanced steatosis. Geographic fatty sparing in the subcapsular anterior liver. No discrete liver lesion on this unenhanced exam. Small calcified gallstone, series 2, image 29. No gallbladder inflammation. No biliary dilatation. Pancreas: No ductal dilatation or inflammation. Spleen: Normal in size without focal abnormality. Adrenals/Urinary Tract: Normal adrenal glands. Minimal left hydroureteronephrosis with ureter dilated to the bladder insertion. There are no renal ureteral calculi. Mild left perinephric edema. There is no evidence of focal renal abnormality or fluid collection. No right hydronephrosis or stone. Partially distended urinary bladder, no wall thickening or bladder stone. Stomach/Bowel: Stomach is within  normal limits. Appendix appears normal. No evidence of bowel wall thickening, distention, or inflammatory changes. Moderate volume of colonic stool. Vascular/Lymphatic: Mild atherosclerosis of the aortoiliac bifurcation. No aneurysm. No adenopathy. Reproductive: Uterus and bilateral adnexa are unremarkable. Other: No free air, free fluid, or intra-abdominal fluid collection. Tiny fat containing umbilical hernia. Musculoskeletal: There are no acute or suspicious osseous abnormalities. 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 IMPRESSION: 1. Minimal left hydroureteronephrosis and perinephric edema without obstructing stone. Findings may be due to recently passed stone or urinary tract infection. 2. Hepatomegaly and advanced hepatic steatosis. 3. Cholelithiasis without gallbladder inflammation. Aortic Atherosclerosis (ICD10-I70.0). Electronically Signed   By: Keith Rake M.D.   On: 08/29/2022 18:10   DG Chest 2 View  Result Date: 08/29/2022 CLINICAL DATA:  Chest pain EXAM: CHEST - 2 VIEW COMPARISON:  Radiograph 12/16/2027 FINDINGS:  Cardiomediastinal silhouette is within normal limits. There is no focal airspace consolidation. There is no pleural effusion. No pneumothorax. There is and no acute osseous abnormality. Thoracic spondylosis. IMPRESSION: No evidence of acute cardiopulmonary disease. Electronically Signed   By: Maurine Simmering M.D.   On: 08/29/2022 17:00    Procedures .Critical Care  Performed by: Sherrill Raring, PA-C Authorized by: Sherrill Raring, PA-C   Critical care provider statement:    Critical care time (minutes):  30   Critical care start time:  08/29/2022 8:00 PM   Critical care end time:  08/29/2022 8:30 PM   Critical care was necessary to treat or prevent imminent or life-threatening deterioration of the following conditions:  Metabolic crisis   Critical care was time spent personally by me on the following activities:  Development of treatment plan with patient or surrogate, discussions with  consultants, evaluation of patient's response to treatment, examination of patient, ordering and review of laboratory studies, ordering and review of radiographic studies, ordering and performing treatments and interventions, pulse oximetry, re-evaluation of patient's condition and review of old charts     Medications Ordered in ED Medications  potassium chloride 10 mEq in 100 mL IVPB (10 mEq Intravenous New Bag/Given 08/29/22 2110)  acetaminophen (TYLENOL) tablet 650 mg (650 mg Oral Given 08/29/22 1632)  lactated ringers bolus 1,000 mL (0 mLs Intravenous Stopped 08/29/22 1800)  cefTRIAXone (ROCEPHIN) 1 g in sodium chloride 0.9 % 100 mL IVPB (0 g Intravenous Stopped 08/29/22 1750)  ibuprofen (ADVIL) tablet 800 mg (800 mg Oral Given 08/29/22 1734)  magnesium sulfate IVPB 2 g 50 mL (0 g Intravenous Stopped 08/29/22 1917)  HYDROmorphone (DILAUDID) injection 1 mg (1 mg Intravenous Given 08/29/22 1757)  ondansetron (ZOFRAN) injection 4 mg (4 mg Intravenous Given 08/29/22 1950)  0.9 %  sodium chloride infusion ( Intravenous New Bag/Given 08/29/22 2108)    ED Course/ Medical Decision Making/ A&P                           Medical Decision Making Amount and/or Complexity of Data Reviewed Labs: ordered. Radiology: ordered.  Risk OTC drugs. Prescription drug management. Decision regarding hospitalization.   Patient presents due to dysuria, nausea, vomiting and right-sided flank pain.  Clinically her presentation seems very consistent with pyelonephritis.  Also considered septic stone, nephrolithiasis, UTI complicated, AKI, dehydration, sepsis.  On initial exam patient meets SIRS criteria.  She is febrile, tachycardic and tachypneic.  She has right CVA tenderness, she is also ill-appearing and very diaphoretic.   I ordered, viewed and interpreted laboratory work-up.  CBC with leukocytosis of 12 with a significant neutrophilic predominance at 10.  Not anemic.  CMP shows mild hypokalemia at 3.1, mag is also  decreased at 1.5.  Patient respectively also so prolonged QT interval on the EKG likely due to hypomagnesia.  No AKI, UA is notable for nitrite and leukocyte positive.  I ordered and reviewed imaging.  CT renal stone is notable for left mild hydronephrosis.  Also stranding which are consistent with possibly recently passed stone.  Chest x-ray is negative for pneumonia.  I ordered magnesium, lactated Ringer's, Dilaudid, Rocephin, ibuprofen and Tylenol for the patient.  On reevaluation her fever has come down to 99.9.  She is still mildly tachycardic at 102 stable blood pressure.  Discussed admission to hospital versus outpatient.  Patient very strongly does not want to come into the hospital.  Will p.o. challenge.  Patient is unable to tolerate  p.o.  Discussed with hospitalist who agrees with admission.  I also ordered magnesium and potassium supplementation in the setting of QT prolongation.  Patient admitted for pyelonephritis.        Final Clinical Impression(s) / ED Diagnoses Final diagnoses:  Pyelonephritis    Rx / DC Orders ED Discharge Orders     None         Sherrill Raring, Hershal Coria 08/29/22 2132    Hayden Rasmussen, MD 08/30/22 1058

## 2022-08-29 NOTE — ED Notes (Signed)
No follow up troponin or lactic acid needed

## 2022-08-29 NOTE — Assessment & Plan Note (Signed)
Given acute illness we will hold off on home meds for today

## 2022-08-29 NOTE — Assessment & Plan Note (Signed)
Allow permissive hypertension 

## 2022-08-29 NOTE — ED Notes (Signed)
Pt ambulates to the bathroom to void for 2nd time since in room.  Husband supportive and accompanying pt.

## 2022-08-29 NOTE — ED Notes (Signed)
Patient transported to CT 

## 2022-08-29 NOTE — Subjective & Objective (Signed)
Dysuria for the past 5 days has been worsening progressively.  And associated flank pains fevers headache nausea and vomiting she has tried to use Tylenol but vomited. No hematuria pain worse in the right flank and constant chills noted

## 2022-08-29 NOTE — Assessment & Plan Note (Signed)
Continue Rocephin await results of urine culture

## 2022-08-29 NOTE — ED Triage Notes (Signed)
Patient here POV from Home.  Endorses UTI Symptoms beginning Tuesday that have worsened since it began.  Associated with Intermittent Flank Pain, Fevers, Headache, N/V that have worsened since.   Tylenol at 0300. Vomited at 1200 Today.   NAD Noted during Triage. A&Ox4. GCS 15. Ambulatory.

## 2022-08-29 NOTE — Assessment & Plan Note (Signed)
Chronic-stable.

## 2022-08-29 NOTE — Assessment & Plan Note (Signed)
-  SIRS criteria met with  elevated white blood cell count,       Component Value Date/Time   WBC 12.2 (H) 08/29/2022 1621   LYMPHSABS 0.9 08/29/2022 1621    tachycardia   ,  Fever RR >20 Today's Vitals   08/29/22 1900 08/29/22 2000 08/29/22 2100 08/29/22 2257  BP: (!) 149/91 138/77 131/78 (!) 158/92  Pulse: (!) 102 93 96 91  Resp: $Remo'16 16 16 18  'HiPnb$ Temp:   97.7 F (36.5 C) (!) 97.5 F (36.4 C)  TempSrc:   Oral Oral  SpO2: 90% 93% 96% 97%  Weight:      Height:      PainSc:   4       Vitals:   08/29/22 1900 08/29/22 2000 08/29/22 2100 08/29/22 2257  BP: (!) 149/91 138/77 131/78 (!) 158/92  Pulse: (!) 102 93 96 91  Resp: $Remo'16 16 16 18  'otPpa$ Temp:   97.7 F (36.5 C) (!) 97.5 F (36.4 C)  TempSrc:   Oral Oral  SpO2: 90% 93% 96% 97%  Weight:      Height:       -Most likely source being: urinary,       - Obtain serial lactic acid and procalcitonin level.  - Initiated IV antibiotics in ER: Antibiotics Given (last 72 hours)    Date/Time Action Medication Dose Rate   08/29/22 1720 New Bag/Given   cefTRIAXone (ROCEPHIN) 1 g in sodium chloride 0.9 % 100 mL IVPB 1 g 200 mL/hr      Will continue  on : Rocephin   - await results of blood and urine culture  - Rehydrate aggressively  Intravenous fluids were administered,   30cc/kg fluid   11:05 PM

## 2022-08-29 NOTE — Assessment & Plan Note (Addendum)
Atypical in the setting of potasium administration improved once potassium stopped Will obtain ecg Echo in AM Trop neg x1 will repeat Given recurrent CP

## 2022-08-29 NOTE — Assessment & Plan Note (Signed)
-   will monitor on tele avoid QT prolonging medications, rehydrate correct electrolytes ? ?

## 2022-08-29 NOTE — H&P (Signed)
Tracey Wood [id number] DOB: 1966/10/09 DOA: 08/29/2022     PCP: Martinique, Betty G, MD   Outpatient Specialists:     Rheumatology [facility]  Patient arrived to ER on 08/29/22 at 1528 Referred by Attending No att. providers found   Patient coming from:    home Lives With family    Chief Complaint:    Chief Complaint  Patient presents with   Urinary Tract Infection    HPI: Tracey Wood is a 56 y.o. female with medical history significant of hypertension, RA    Presented with fevers chills dysuria nausea vomiting Dysuria for the past 5 days has been worsening progressively.  And associated flank pains fevers headache nausea and vomiting she has tried to use Tylenol but vomited. No hematuria pain worse in the right flank and constant chills noted  Reports left side pain  Never had a kidney stone Does not smoke no etoh  Now nausea and pain has improved Regarding pertinent Chronic problems:    RA on Orencia injection last shot was last sunday  HTN on amlodipine benazepril     While in ER:  Noted to have sodium down to 132 potassium down to 3.1 white blood cell count elevated 12.2 UA showed evidence of UTI magnesium down to 1.5 Magnesium 1.5 patient was given a dose of Rocephin magnesium was repleted she was given 1 round of potassium and 1 L LR bolus CT scan showed left mild hydronephrosis and stranding which consistent with possibly recently passed stone    CXR -  NON acute  CTabd/pelvis -left mild hydro no obstructive stone Hepatic steatosis  Following Medications were ordered in ER: Medications  potassium chloride 10 mEq in 100 mL IVPB (10 mEq Intravenous New Bag/Given 08/29/22 2241)  acetaminophen (TYLENOL) tablet 650 mg (650 mg Oral Given 08/29/22 1632)  lactated ringers bolus 1,000 mL (0 mLs Intravenous Stopped 08/29/22 1800)  cefTRIAXone (ROCEPHIN) 1 g in sodium chloride 0.9 % 100 mL IVPB (0 g Intravenous Stopped 08/29/22 1750)  ibuprofen (ADVIL)  tablet 800 mg (800 mg Oral Given 08/29/22 1734)  magnesium sulfate IVPB 2 g 50 mL (0 g Intravenous Stopped 08/29/22 1917)  HYDROmorphone (DILAUDID) injection 1 mg (1 mg Intravenous Given 08/29/22 1757)  ondansetron (ZOFRAN) injection 4 mg (4 mg Intravenous Given 08/29/22 1950)  0.9 %  sodium chloride infusion ( Intravenous New Bag/Given 08/29/22 2108)    ______    ED Triage Vitals  Enc Vitals Group     BP 08/29/22 1615 (!) 165/83     Pulse Rate 08/29/22 1615 (!) 141     Resp 08/29/22 1615 (!) 22     Temp 08/29/22 1615 (!) 102.1 F (38.9 C)     Temp Source 08/29/22 1702 Oral     SpO2 08/29/22 1615 97 %     Weight 08/29/22 1617 160 lb (72.6 kg)     Height 08/29/22 1617 '5\' 2"'  (1.575 m)     Head Circumference --      Peak Flow --      Pain Score 08/29/22 1616 8     Pain Loc --      Pain Edu? --      Excl. in [place]? --   TMAX(24)@     _________________________________________ Significant initial  Findings: Abnormal Labs Reviewed  COMPREHENSIVE METABOLIC PANEL - Abnormal; Notable for the following components:      Result Value   Sodium 132 (*)    Potassium 3.1 (*)  CO2 20 (*)    Glucose, Bld 150 (*)    All other components within normal limits  CBC WITH DIFFERENTIAL/PLATELET - Abnormal; Notable for the following components:   WBC 12.2 (*)    Neutro Abs 10.7 (*)    All other components within normal limits  URINALYSIS, ROUTINE W REFLEX MICROSCOPIC - Abnormal; Notable for the following components:   APPearance HAZY (*)    Hgb urine dipstick MODERATE (*)    Ketones, ur 40 (*)    Protein, ur 100 (*)    Nitrite POSITIVE (*)    Leukocytes,Ua LARGE (*)    Bacteria, UA MANY (*)    All other components within normal limits  MAGNESIUM - Abnormal; Notable for the following components:   Magnesium 1.5 (*)    All other components within normal limits       Trop 4 ECG: Ordered Personally reviewed and interpreted by me showing: HR : 136 Rhythm:   Sinus tachycardia   no evidence of ischemic  changes QTC 568   Repeat ECG  HR 83 1st degree AV block  QTC 455 ____________________ This patient meets SIRS Criteria and may be septic.    The recent clinical data is shown below. Vitals:   08/29/22 1803 08/29/22 1900 08/29/22 2000 08/29/22 2100  BP: (!) 145/82 (!) 149/91 138/77 131/78  Pulse: (!) 113 (!) 102 93 96  Resp: '16 16 16 16  ' Temp: 99.9 F (37.7 C)   97.7 F (36.5 C)  TempSrc:    Oral  SpO2: 94% 90% 93% 96%  Weight:      Height:        WBC     Component Value Date/Time   WBC 12.2 (H) 08/29/2022 1621   LYMPHSABS 0.9 08/29/2022 1621   MONOABS 0.6 08/29/2022 1621   EOSABS 0.0 08/29/2022 1621   BASOSABS 0.0 08/29/2022 1621     Lactic Acid, Venous    Component Value Date/Time   LATICACIDVEN 1.4 08/29/2022 1621     Procalcitonin   Ordered     UA   evidence of UTI      Urine analysis:    Component Value Date/Time   COLORURINE YELLOW 08/29/2022 1621   APPEARANCEUR HAZY (A) 08/29/2022 1621   LABSPEC 1.015 08/29/2022 1621   PHURINE 5.5 08/29/2022 1621   GLUCOSEU NEGATIVE 08/29/2022 1621   GLUCOSEU NEGATIVE 04/16/2021 1545   HGBUR MODERATE (A) 08/29/2022 1621   BILIRUBINUR NEGATIVE 08/29/2022 1621   BILIRUBINUR neg 04/16/2021 1543   KETONESUR 40 (A) 08/29/2022 1621   PROTEINUR 100 (A) 08/29/2022 1621   UROBILINOGEN 0.2 04/16/2021 1545   UROBILINOGEN 0.2 04/16/2021 1543   NITRITE POSITIVE (A) 08/29/2022 1621   LEUKOCYTESUR LARGE (A) 08/29/2022 1621    Results for orders placed or performed during the hospital encounter of 08/29/22  Culture, blood (Routine x 2)     Status: None (Preliminary result)   Collection Time: 08/29/22  5:17 PM   Specimen: BLOOD  Result Value Ref Range Status   Specimen Description BLOOD SITE NOT SPECIFIED  Final   Special Requests   Final    BOTTLES DRAWN AEROBIC AND ANAEROBIC Blood Culture results may not be optimal due to an inadequate volume of blood received in culture bottles Performed at [facility],  [address] [place], [place] [address]    Culture PENDING  Incomplete   Report Status PENDING  Incomplete   ____________________________________ Hospitalist was called for admission for   Pyelonephritis and sepsis  The following Work up has been ordered so far:  Orders Placed This Encounter  Procedures   Critical Care   Culture, blood (Routine x 2)   DG Chest 2 View   CT Renal Stone Study   Comprehensive metabolic panel   CBC with Differential   Protime-INR   Urinalysis, Routine w reflex microscopic   Pregnancy, urine   Magnesium   Notify physician (specify)  Specify: Notify provider for possible Code Sepsis   Document height and weight   Document Height and Actual Weight   Fluid/PO Challenge   Cardiac monitoring   Consult to hospitalist   EKG 12-Lead   ED EKG   Place in observation (patient's expected length of stay will be less than 2 midnights)     OTHER Significant initial  Findings:  labs showing:    Recent Labs  Lab 08/29/22 1621 08/29/22 1638  NA 132*  --   K 3.1*  --   CO2 20*  --   GLUCOSE 150*  --   BUN 9  --   CREATININE 0.69  --   CALCIUM 8.9  --   MG  --  1.5*    Cr   stable,   Lab Results  Component Value Date   CREATININE 0.69 08/29/2022   CREATININE 0.83 11/05/2020   CREATININE 0.65 03/12/2020    Recent Labs  Lab 08/29/22 1621  AST 17  ALT 25  ALKPHOS 83  BILITOT 1.1  PROT 7.8  ALBUMIN 4.2   Lab Results  Component Value Date   CALCIUM 8.9 08/29/2022        Plt: Lab Results  Component Value Date   PLT 246 08/29/2022      COVID-19 Labs  No results for input(s): "DDIMER", "FERRITIN", "LDH", "CRP" in the last 72 hours.  Lab Results  Component Value Date   SARSCOV2NAA RESULT: NEGATIVE 03/31/2020   SARSCOV2NAA Detected (A) 11/30/2019   SARSCOV2NAA Detected (A) 11/19/2019   SARSCOV2NAA Not Detected 10/08/2019        Recent Labs  Lab 08/29/22 1621  WBC 12.2*  NEUTROABS 10.7*  HGB 12.5  HCT 37.4  MCV 86.8   PLT 246    HG/HCT   stable,       Component Value Date/Time   HGB 12.5 08/29/2022 1621   HCT 37.4 08/29/2022 1621   MCV 86.8 08/29/2022 1621      Cardiac Panel (last 3 results) No results for input(s): "CKTOTAL", "CKMB", "TROPONINI", "RELINDX" in the last 72 hours.    DM  labs:  HbA1C: Recent Labs    10/14/21 1031  HGBA1C 5.9       CBG (last 3)  No results for input(s): "GLUCAP" in the last 72 hours.        Cultures:    Component Value Date/Time   SDES BLOOD SITE NOT SPECIFIED 08/29/2022 1717   SPECREQUEST  08/29/2022 1717    BOTTLES DRAWN AEROBIC AND ANAEROBIC Blood Culture results may not be optimal due to an inadequate volume of blood received in culture bottles Performed at [facility], [place] [address], [place], [place] [address]    CULT PENDING 08/29/2022 1717   REPTSTATUS PENDING 08/29/2022 1717     Radiological Exams on Admission: CT Renal Stone Study  Result Date: 08/29/2022 CLINICAL DATA:  Flank pain, kidney stone suspected EXAM: CT ABDOMEN AND PELVIS WITHOUT CONTRAST TECHNIQUE: Multidetector CT imaging of the abdomen and pelvis was performed following the standard protocol without IV contrast. RADIATION DOSE  REDUCTION: This exam was performed according to the departmental dose-optimization program which includes automated exposure control, adjustment of the mA and/or kV according to patient size and/or use of iterative reconstruction technique. COMPARISON:  CT 06/22/2021 FINDINGS: Lower chest: No acute airspace disease.  No pleural effusion. Hepatobiliary: The liver is enlarged spanning 20.4 cm cranial caudal with advanced steatosis. Geographic fatty sparing in the subcapsular anterior liver. No discrete liver lesion on this unenhanced exam. Small calcified gallstone, series 2, image 29. No gallbladder inflammation. No biliary dilatation. Pancreas: No ductal dilatation or inflammation. Spleen: Normal in size without focal abnormality. Adrenals/Urinary  Tract: Normal adrenal glands. Minimal left hydroureteronephrosis with ureter dilated to the bladder insertion. There are no renal ureteral calculi. Mild left perinephric edema. There is no evidence of focal renal abnormality or fluid collection. No right hydronephrosis or stone. Partially distended urinary bladder, no wall thickening or bladder stone. Stomach/Bowel: Stomach is within normal limits. Appendix appears normal. No evidence of bowel wall thickening, distention, or inflammatory changes. Moderate volume of colonic stool. Vascular/Lymphatic: Mild atherosclerosis of the aortoiliac bifurcation. No aneurysm. No adenopathy. Reproductive: Uterus and bilateral adnexa are unremarkable. Other: No free air, free fluid, or intra-abdominal fluid collection. Tiny fat containing umbilical hernia. Musculoskeletal: There are no acute or suspicious osseous abnormalities. 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 IMPRESSION: 1. Minimal left hydroureteronephrosis and perinephric edema without obstructing stone. Findings may be due to recently passed stone or urinary tract infection. 2. Hepatomegaly and advanced hepatic steatosis. 3. Cholelithiasis without gallbladder inflammation. Aortic Atherosclerosis (ICD10-I70.0). Electronically Signed   By: Keith Rake M.D.   On: 08/29/2022 18:10   DG Chest 2 View  Result Date: 08/29/2022 CLINICAL DATA:  Chest pain EXAM: CHEST - 2 VIEW COMPARISON:  Radiograph 12/16/2027 FINDINGS: Cardiomediastinal silhouette is within normal limits. There is no focal airspace consolidation. There is no pleural effusion. No pneumothorax. There is and no acute osseous abnormality. Thoracic spondylosis. IMPRESSION: No evidence of acute cardiopulmonary disease. Electronically Signed   By: Maurine Simmering M.D.   On: 08/29/2022 17:00   _______________________________________________________________________________________________________ Latest  Blood pressure 131/78, pulse 96, temperature 97.7 F (36.5 C),  temperature source Oral, resp. rate 16, height '5\' 2"'  (1.575 m), weight 72.6 kg, SpO2 96 %.   Vitals  labs and radiology finding personally reviewed  Review of Systems:    Pertinent positives include:   Fevers, chills, fatigue  abdominal pain, nausea, vomiting,  Constitutional:  No weight loss, night sweats,, weight loss  HEENT:  No headaches, Difficulty swallowing,Tooth/dental problems,Sore throat,  No sneezing, itching, ear ache, nasal congestion, post nasal drip,  Cardio-vascular:  No chest pain, Orthopnea, PND, anasarca, dizziness, palpitations.no Bilateral lower extremity swelling  GI:  No heartburn, indigestion,diarrhea, change in bowel habits, loss of appetite, melena, blood in stool, hematemesis Resp:  no shortness of breath at rest. No dyspnea on exertion, No excess mucus, no productive cough, No non-productive cough, No coughing up of blood.No change in color of mucus.No wheezing. Skin:  no rash or lesions. No jaundice GU:  no dysuria, change in color of urine, no urgency or frequency. No straining to urinate.  No flank pain.  Musculoskeletal:  No joint pain or no joint swelling. No decreased range of motion. No back pain.  Psych:  No change in mood or affect. No depression or anxiety. No memory loss.  Neuro: no localizing neurological complaints, no tingling, no weakness, no double vision, no gait abnormality, no slurred speech, no confusion  All systems reviewed and apart from [facility]  all are negative _______________________________________________________________________________________________ Past Medical History:   Past Medical History:  Diagnosis Date   Bronchitis    Cataract    had sx    Hypertension    Rheumatoid arthritis ([place])    Thyroid disease    UTI (urinary tract infection)      Past Surgical History:  Procedure Laterality Date   CATARACT EXTRACTION     R & L   TUBAL LIGATION      Social History:  Ambulatory   independently       reports  that she has never smoked. She has never used smokeless tobacco. She reports that she does not drink alcohol and does not use drugs.    Family History:   Family History  Problem Relation Age of Onset   Breast cancer Maternal Grandmother 29   Colon cancer Neg Hx    Colon polyps Neg Hx    Esophageal cancer Neg Hx    Rectal cancer Neg Hx    Stomach cancer Neg Hx    ______________________________________________________________________________________________ Allergies: Allergies  Allergen Reactions   Morphine And Related Other (See Comments)    Fast heart beat   Ciprofloxacin Rash   Oxycodone Palpitations   Tape Dermatitis     Prior to Admission medications   Medication Sig Start Date End Date Taking? Authorizing Provider  amLODipine-benazepril (LOTREL) 5-20 MG capsule TAKE 1 CAPSULE BY MOUTH EVERY DAY 11/18/21   Martinique, Betty G, MD  meclizine (ANTIVERT) 25 MG tablet Take 1 tablet (25 mg total) by mouth 3 (three) times daily as needed for dizziness. 10/14/21   Martinique, Betty G, MD  meloxicam (MOBIC) 15 MG tablet Take 15 mg by mouth daily. 08/27/19   [provider]  methocarbamol (ROBAXIN) 500 MG tablet Take 2 tablets (1,000 mg total) by mouth 4 (four) times daily. 01/25/22   Carlisle Cater, PA-C  ORENCIA CLICKJECT 062 MG/ML SOAJ Inject 1 Syringe into the skin once a week. 05/06/20   [provider]    ___________________________________________________________________________________________________ Physical Exam:    08/29/2022    9:00 PM 08/29/2022    8:00 PM 08/29/2022    7:00 PM  Vitals with BMI  Systolic [phone]  Diastolic 78 77 91  Pulse 96 93 102     1. General:  in No  Acute distress   well   -appearing 2. Psychological: Alert and   Oriented 3. Head/ENT:   Dry Mucous Membranes                          Head Non traumatic, neck supple                          Normal   Dentition 4. SKIN: decreased Skin turgor,  Skin clean Dry and intact no  rash 5. Heart: Regular rate and rhythm no  Murmur, no Rub or gallop 6. Lungs:  , no wheezes or crackles   7. Abdomen: Soft,  non-tender, Non distended bowel sounds present 8. Lower extremities: no clubbing, cyanosis, no  edema 9. Neurologically Grossly intact, moving all 4 extremities equally   10. MSK: Normal range of motion    Chart has been reviewed  ______________________________________________________________________________________________  Assessment/Plan 56 y.o. female with medical history significant of hypertension, RA    Admitted for   Pyelonephritis and sepsis      Present on Admission:  Acute pyelonephritis  Hypertension, essential, benign  Rheumatoid  arthritis involving multiple sites with positive rheumatoid factor (HCC)  Hepatic steatosis  Sepsis ([place])  Prolonged QT interval  Hypokalemia  Hypomagnesemia  Chest pain     Acute pyelonephritis Continue Rocephin await results of urine culture  Hypertension, essential, benign Allow permissive hypertension  Rheumatoid arthritis involving multiple sites with positive rheumatoid factor ([place]) Given acute illness we will hold off on home meds for today  Hepatic steatosis Chronic stable  Sepsis ([place])  -SIRS criteria met with  elevated white blood cell count,       Component Value Date/Time   WBC 12.2 (H) 08/29/2022 1621   LYMPHSABS 0.9 08/29/2022 1621    tachycardia   ,  Fever RR >20 Today's Vitals   08/29/22 1900 08/29/22 2000 08/29/22 2100 08/29/22 2257  BP: (!) 149/91 138/77 131/78 (!) 158/92  Pulse: (!) 102 93 96 91  Resp: '16 16 16 18  ' Temp:   97.7 F (36.5 C) (!) 97.5 F (36.4 C)  TempSrc:   Oral Oral  SpO2: 90% 93% 96% 97%  Weight:      Height:      PainSc:   4       Vitals:   08/29/22 1900 08/29/22 2000 08/29/22 2100 08/29/22 2257  BP: (!) 149/91 138/77 131/78 (!) 158/92  Pulse: (!) 102 93 96 91  Resp: '16 16 16 18  ' Temp:   97.7 F (36.5 C) (!) 97.5 F (36.4 C)  TempSrc:   Oral  Oral  SpO2: 90% 93% 96% 97%  Weight:      Height:       -Most likely source being: urinary,       - Obtain serial lactic acid and procalcitonin level.  - Initiated IV antibiotics in ER: Antibiotics Given (last 72 hours)     Date/Time Action Medication Dose Rate   08/29/22 1720 New Bag/Given   cefTRIAXone (ROCEPHIN) 1 g in sodium chloride 0.9 % 100 mL IVPB 1 g 200 mL/hr       Will continue  on : Rocephin   - await results of blood and urine culture  - Rehydrate aggressively  Intravenous fluids were administered,   30cc/kg fluid   11:05 PM   Prolonged QT interval - will monitor on tele avoid QT prolonging medications, rehydrate correct electrolytes   Hypokalemia Recheck potassium level and replace as needed make sure magnesium is repleted  Hypomagnesemia  continue to replace as needed  Chest pain Atypical in the setting of potasium administration improved once potassium stopped Will obtain ecg Echo in AM Trop neg x1 will repeat Given recurrent CP   Other plan as per orders.  DVT prophylaxis:  SCD      Code Status:    Code Status: Not on file FULL CODE  as per patient   I had personally discussed CODE STATUS with patient     Family Communication:   Family not at  Bedside    Disposition Plan:    To home once workup is complete and patient is stable   Following barriers for discharge:                            Electrolytes corrected  Pain controlled with PO medications                               Afebrile, white count improving able to transition to PO antibiotics                       Consults called: none    Admission status:  ED Disposition     ED Disposition  Admit   Condition  --   [place]: [facility] [[id number]]  Level of Care: Telemetry [5]  Admit to tele based on following criteria: Complex arrhythmia (Bradycardia/Tachycardia)  Interfacility  transfer: Yes  May place patient in observation at [facility] or [facility] if equivalent level of care is available:: Yes  Covid Evaluation: Asymptomatic - no recent exposure (last 10 days) testing not required  Diagnosis: Acute pyelonephritis [[id number]]  Admitting Physician: Shela Leff [[id number]]  Attending Physician: Hayden Rasmussen [[id number]]           Obs      Level of care     tele  For 12H     Pooja Camuso [place] 08/29/2022, 11:58 PM    [facility]     after 2 AM please page floor coverage PA If 7AM-7PM, please contact the day team taking care of the patient using [email or web address]   Patient was evaluated in the context of the global COVID-19 pandemic, which necessitated consideration that the patient might be at risk for infection with the SARS-CoV-2 virus that causes COVID-19. Institutional protocols and algorithms that pertain to the evaluation of patients at risk for COVID-19 are in a state of rapid change based on information released by regulatory bodies including the CDC and federal and state organizations. These policies and algorithms were followed during the patient's care.

## 2022-08-29 NOTE — Assessment & Plan Note (Signed)
continue to replace as needed

## 2022-08-29 NOTE — Assessment & Plan Note (Signed)
Recheck potassium level and replace as needed make sure magnesium is repleted

## 2022-08-30 ENCOUNTER — Observation Stay (HOSPITAL_COMMUNITY): Payer: 59

## 2022-08-30 ENCOUNTER — Other Ambulatory Visit: Payer: Self-pay | Admitting: Family Medicine

## 2022-08-30 ENCOUNTER — Other Ambulatory Visit: Payer: Self-pay

## 2022-08-30 DIAGNOSIS — I10 Essential (primary) hypertension: Secondary | ICD-10-CM

## 2022-08-30 DIAGNOSIS — E876 Hypokalemia: Secondary | ICD-10-CM | POA: Diagnosis not present

## 2022-08-30 DIAGNOSIS — A419 Sepsis, unspecified organism: Secondary | ICD-10-CM

## 2022-08-30 DIAGNOSIS — R079 Chest pain, unspecified: Secondary | ICD-10-CM

## 2022-08-30 DIAGNOSIS — R0789 Other chest pain: Secondary | ICD-10-CM | POA: Diagnosis not present

## 2022-08-30 DIAGNOSIS — N10 Acute tubulo-interstitial nephritis: Secondary | ICD-10-CM | POA: Diagnosis not present

## 2022-08-30 LAB — CBC WITH DIFFERENTIAL/PLATELET
Abs Immature Granulocytes: 0.05 10*3/uL (ref 0.00–0.07)
Basophils Absolute: 0 10*3/uL (ref 0.0–0.1)
Basophils Relative: 0 %
Eosinophils Absolute: 0 10*3/uL (ref 0.0–0.5)
Eosinophils Relative: 0 %
HCT: 32.9 % — ABNORMAL LOW (ref 36.0–46.0)
Hemoglobin: 10.8 g/dL — ABNORMAL LOW (ref 12.0–15.0)
Immature Granulocytes: 1 %
Lymphocytes Relative: 18 %
Lymphs Abs: 1.9 10*3/uL (ref 0.7–4.0)
MCH: 30.3 pg (ref 26.0–34.0)
MCHC: 32.8 g/dL (ref 30.0–36.0)
MCV: 92.4 fL (ref 80.0–100.0)
Monocytes Absolute: 0.8 10*3/uL (ref 0.1–1.0)
Monocytes Relative: 8 %
Neutro Abs: 7.4 10*3/uL (ref 1.7–7.7)
Neutrophils Relative %: 73 %
Platelets: 196 10*3/uL (ref 150–400)
RBC: 3.56 MIL/uL — ABNORMAL LOW (ref 3.87–5.11)
RDW: 13.6 % (ref 11.5–15.5)
WBC: 10.2 10*3/uL (ref 4.0–10.5)
nRBC: 0 % (ref 0.0–0.2)

## 2022-08-30 LAB — TROPONIN I (HIGH SENSITIVITY): Troponin I (High Sensitivity): 3 ng/L (ref <18)

## 2022-08-30 LAB — CK: Total CK: 134 U/L (ref 38–234)

## 2022-08-30 LAB — HIV ANTIBODY (ROUTINE TESTING W REFLEX): HIV Screen 4th Generation wRfx: NONREACTIVE

## 2022-08-30 LAB — COMPREHENSIVE METABOLIC PANEL
ALT: 25 U/L (ref 0–44)
AST: 17 U/L (ref 15–41)
Albumin: 3 g/dL — ABNORMAL LOW (ref 3.5–5.0)
Alkaline Phosphatase: 60 U/L (ref 38–126)
Anion gap: 4 — ABNORMAL LOW (ref 5–15)
BUN: 7 mg/dL (ref 6–20)
CO2: 24 mmol/L (ref 22–32)
Calcium: 7.9 mg/dL — ABNORMAL LOW (ref 8.9–10.3)
Chloride: 107 mmol/L (ref 98–111)
Creatinine, Ser: 0.66 mg/dL (ref 0.44–1.00)
GFR, Estimated: >60 mL/min (ref >60)
Glucose, Bld: 111 mg/dL — ABNORMAL HIGH (ref 70–99)
Potassium: 3.3 mmol/L — ABNORMAL LOW (ref 3.5–5.1)
Sodium: 135 mmol/L (ref 135–145)
Total Bilirubin: 0.6 mg/dL (ref 0.3–1.2)
Total Protein: 6.2 g/dL — ABNORMAL LOW (ref 6.5–8.1)

## 2022-08-30 LAB — ECHOCARDIOGRAM COMPLETE
Area-P 1/2: 3.99 cm2
Calc EF: 61.9 %
Height: 62 in
S' Lateral: 2.5 cm
Single Plane A2C EF: 58.6 %
Single Plane A4C EF: 65.4 %
Weight: 2560 oz

## 2022-08-30 LAB — BASIC METABOLIC PANEL
Anion gap: 8 (ref 5–15)
BUN: 7 mg/dL (ref 6–20)
CO2: 23 mmol/L (ref 22–32)
Calcium: 8.3 mg/dL — ABNORMAL LOW (ref 8.9–10.3)
Chloride: 105 mmol/L (ref 98–111)
Creatinine, Ser: 0.62 mg/dL (ref 0.44–1.00)
GFR, Estimated: >60 mL/min (ref >60)
Glucose, Bld: 161 mg/dL — ABNORMAL HIGH (ref 70–99)
Potassium: 3.6 mmol/L (ref 3.5–5.1)
Sodium: 136 mmol/L (ref 135–145)

## 2022-08-30 LAB — PROCALCITONIN: Procalcitonin: 4.65 ng/mL

## 2022-08-30 LAB — LIPID PANEL
Cholesterol: 132 mg/dL (ref 0–200)
HDL: 44 mg/dL (ref >40)
LDL Cholesterol: 71 mg/dL (ref 0–99)
Total CHOL/HDL Ratio: 3 RATIO
Triglycerides: 87 mg/dL (ref <150)
VLDL: 17 mg/dL (ref 0–40)

## 2022-08-30 LAB — D-DIMER, QUANTITATIVE: D-Dimer, Quant: 1.1 ug/mL-FEU — ABNORMAL HIGH (ref 0.00–0.50)

## 2022-08-30 LAB — PHOSPHORUS
Phosphorus: 2.9 mg/dL (ref 2.5–4.6)
Phosphorus: 2.9 mg/dL (ref 2.5–4.6)

## 2022-08-30 LAB — MAGNESIUM
Magnesium: 2.1 mg/dL (ref 1.7–2.4)
Magnesium: 2.1 mg/dL (ref 1.7–2.4)

## 2022-08-30 LAB — TSH: TSH: 1.361 u[IU]/mL (ref 0.350–4.500)

## 2022-08-30 LAB — GLUCOSE, CAPILLARY: Glucose-Capillary: 99 mg/dL (ref 70–99)

## 2022-08-30 MED ORDER — PROCHLORPERAZINE EDISYLATE 10 MG/2ML IJ SOLN
10.0000 mg | Freq: Four times a day (QID) | INTRAMUSCULAR | Status: DC | PRN
Start: 2022-08-30 — End: 2022-09-01

## 2022-08-30 MED ORDER — SODIUM CHLORIDE 0.45 % IV BOLUS
500.0000 mL | Freq: Once | INTRAVENOUS | Status: AC
  Administered 2022-08-30: 500 mL via INTRAVENOUS

## 2022-08-30 MED ORDER — METOCLOPRAMIDE HCL 5 MG/ML IJ SOLN
5.0000 mg | Freq: Four times a day (QID) | INTRAMUSCULAR | Status: DC | PRN
  Administered 2022-08-30: 5 mg via INTRAVENOUS
  Filled 2022-08-30: qty 2

## 2022-08-30 MED ORDER — IBUPROFEN 200 MG PO TABS
200.0000 mg | ORAL_TABLET | Freq: Once | ORAL | Status: AC
  Administered 2022-08-30: 200 mg via ORAL
  Filled 2022-08-30: qty 1

## 2022-08-30 MED ORDER — LORATADINE 10 MG PO TABS
10.0000 mg | ORAL_TABLET | Freq: Every day | ORAL | Status: DC
  Filled 2022-08-30 (×2): qty 1

## 2022-08-30 MED ORDER — IOHEXOL 350 MG/ML SOLN
75.0000 mL | Freq: Once | INTRAVENOUS | Status: AC | PRN
  Administered 2022-08-30: 75 mL via INTRAVENOUS

## 2022-08-30 MED ORDER — DIPHENHYDRAMINE HCL 25 MG PO CAPS
25.0000 mg | ORAL_CAPSULE | Freq: Four times a day (QID) | ORAL | Status: DC | PRN
Start: 2022-08-30 — End: 2022-09-01
  Administered 2022-08-30: 25 mg via ORAL
  Filled 2022-08-30: qty 1

## 2022-08-30 MED ORDER — POTASSIUM CHLORIDE CRYS ER 20 MEQ PO TBCR
40.0000 meq | EXTENDED_RELEASE_TABLET | Freq: Once | ORAL | Status: AC
  Administered 2022-08-30: 40 meq via ORAL
  Filled 2022-08-30: qty 2

## 2022-08-30 MED ORDER — ENOXAPARIN SODIUM 40 MG/0.4ML IJ SOSY
40.0000 mg | PREFILLED_SYRINGE | INTRAMUSCULAR | Status: DC
  Administered 2022-08-30 – 2022-08-31 (×2): 40 mg via SUBCUTANEOUS
  Filled 2022-08-30 (×2): qty 0.4

## 2022-08-30 MED ORDER — SODIUM CHLORIDE 0.9 % IV SOLN: INTRAVENOUS | Status: DC

## 2022-08-30 NOTE — TOC Initial Note (Signed)
Transition of Care [facility]) - Initial/Assessment Note    Patient Details  Name: Tracey Wood MRN: [id number] Date of Birth: 1966/05/08  Transition of Care [facility]) CM/SW Contact:    Leeroy Cha, RN Phone Number: 08/30/2022, 9:57 AM  Clinical Narrative:                  Transition of Care [facility]) Screening Note   Patient Details  Name: Tracey Wood Date of Birth: 04-27-1966   Transition of Care [facility]) CM/SW Contact:    Leeroy Cha, RN Phone Number: 08/30/2022, 9:57 AM    Transition of Care Department [facility]) has reviewed patient and no TOC needs have been identified at this time. We will continue to monitor patient advancement through interdisciplinary progression rounds. If new patient transition needs arise, please place a TOC consult.    Expected Discharge Plan: Home/Self Care Barriers to Discharge: Continued Medical Work up   Patient Goals and CMS Choice Patient states their goals for this hospitalization and ongoing recovery are:: to go back home CMS Medicare.gov Compare Post Acute Care list provided to:: Patient    Expected Discharge Plan and Services Expected Discharge Plan: Home/Self Care   Discharge Planning Services: CM Consult   Living arrangements for the past 2 months: Single Family Home                                      Prior Living Arrangements/Services Living arrangements for the past 2 months: Single Family Home Lives with:: Spouse Patient language and need for interpreter reviewed:: Yes Do you feel safe going back to the place where you live?: Yes            Criminal Activity/Legal Involvement Pertinent to Current Situation/Hospitalization: No - Comment as needed  Activities of Daily Living Home Assistive Devices/Equipment: None ADL Screening (condition at time of admission) Patient's cognitive ability adequate to safely complete daily activities?: Yes Is the patient deaf or have difficulty hearing?:  No Does the patient have difficulty seeing, even when wearing glasses/contacts?: No Does the patient have difficulty concentrating, remembering, or making decisions?: No Patient able to express need for assistance with ADLs?: Yes Does the patient have difficulty dressing or bathing?: No Independently performs ADLs?: Yes (appropriate for developmental age) Does the patient have difficulty walking or climbing stairs?: No Weakness of Legs: None Weakness of Arms/Hands: None  Permission Sought/Granted                  Emotional Assessment Appearance:: Appears stated age Attitude/Demeanor/Rapport: Engaged Affect (typically observed): Calm Orientation: : Oriented to Self, Oriented to Place, Oriented to  Time, Oriented to Situation Alcohol / Substance Use: Never Used Psych Involvement: No (comment)  Admission diagnosis:  Pyelonephritis [N12] Acute pyelonephritis [N10] Patient Active Problem List   Diagnosis Date Noted   Acute pyelonephritis 08/29/2022   Sepsis ([place]) 08/29/2022   Prolonged QT interval 08/29/2022   Hypokalemia 08/29/2022   Hypomagnesemia 08/29/2022   Chest pain 08/29/2022   GERD (gastroesophageal reflux disease) 11/05/2020   Hepatic steatosis 05/14/2020   Vitamin D deficiency, unspecified 01/29/2020   Hypertension, essential, benign 01/29/2020   Hyperlipidemia, mixed 01/29/2020   Microhematuria 12/26/2019   Recurrent UTI 11/20/2019   Ovarian cyst, right 10/25/2019   Rotator cuff tendinitis, left 11/16/2016   Acute cystitis without hematuria 05/19/2016   Elevated TSH 05/19/2016   Rheumatoid arthritis involving multiple sites with positive  rheumatoid factor ([place]) 05/19/2016   Routine history and physical examination of adult 05/19/2016   Irregular bleeding 03/11/2016   Reduced libido 03/11/2016   Foreign body sensation in throat 03/03/2016   TOBACCO ABUSE 03/14/2009   PCP:  Martinique, Betty G, MD Pharmacy:   [facility], [place]  [address] [place]  [address] Phone: [phone] Fax: [phone]  [place] [id number], [place] [address] Phone: [phone] Fax: [phone]     Social Determinants of Health (SDOH) Interventions    Readmission Risk Interventions   No data to display

## 2022-08-30 NOTE — Significant Event (Signed)
Rapid Response Event Note   Reason for Call :  Elevated BP, HR, and shaking    Initial Focused Assessment:  Patient appeared to be in acute pain upon assessment. Patient stated she was experiencing 8/10 sharp epigastric pain, and 5/10 left flank pain. Patient was given their PRN fentanyl 32mg. EKG obtained showing the patient was in Sinus Tachycardia. CBG obtained and results were 99.  Cardiac: S1 & S2 heart sounds, pulse regular, rate 120.   Respiratory: Lung sounds clear, patient felt SOB, experiencing pain during inhalation. Patient placed on 2L [place] for comfort. On room air and 2L Spo2 @ 96%.    Neuro: Patient a/o X4 , able to move all extremities   10 minutes after administration of fentanyl patients chills, and pain subsided.    Interventions:  516m PRN fentanyl administered PO PRN tylenol for fever of 100.5  EKG obtained  2L [place] for comfort/SOB  Plan of Care:  MD ordered D-dimer for possible PE work up   MD Notified by bedside RN: Kr[place] Pinkall Time: 14[place]ime: [id number] Time: 14[place]RN

## 2022-08-30 NOTE — Progress Notes (Addendum)
[facility] PROGRESS NOTE   Tracey Wood [id number] DOB: 13-Apr-1966 DOA: 08/29/2022  PCP: Martinique, Betty G, MD  Brief History/Interval Summary: 56 year old female with past medical history of rheumatoid arthritis and hypertension who presented with fever chills nausea vomiting.  Found to have pyelonephritis.  Was hospitalized for further management.  Consultants: None  Procedures: None    Subjective/Interval History: Patient mentions that her nausea has improved.  Left-sided flank pain has also improved.  Feels fatigued though.  Husband is at the bedside.    Assessment/Plan:  Acute pyelonephritis/sepsis, present on admission Patient had fever, leukocytosis and tachycardia at the time of admission. CT scan did show mild left-sided hydroureteronephrosis and perinephric edema.  No obstructing stones were noted.  No abscess. Follow-up on urine and blood cultures.  Lactic acid level was normal.  Procalcitonin 4.65.  WBC is noted to be normal this morning. Continue with ceftriaxone.  History of rheumatoid arthritis Stable.  On Orencia once a week.  Currently on hold due to active infection.  Essential hypertension/hypokalemia/hypomagnesemia Takes amlodipine/benazepril prior to admission.  Currently on hold.  Monitor blood pressures closely. We will replace potassium.  Magnesium is 2.1.  Chest pain Atypical and was in the setting of IV potassium administration.  Appears to have resolved.  EKG did not show any ischemic changes.  Echocardiogram is pending.  ADDENDUM Called by RN that patient was experiencing chest pain.  She was noted to have elevated temperature.  EKG was done which showed sinus tachycardia but without any ischemic changes.  Echocardiogram was done earlier today and shows normal systolic function without any regional wall motion abnormality.  Right ventricular function was also normal.  Symptoms probably due to sepsis and fever and chills.  D-dimer  was obtained because patient mentioned pleuritic chest pain.  D-dimer noted to be elevated.  We will proceed with CT angiogram chest.  Patient was seen.  She is feeling better after she was given fentanyl and Tylenol.  We will continue to monitor closely.  QT prolongation EKG from this morning shows QT interval to be 454.  Normocytic anemia No evidence of overt bleeding.  Drop in hemoglobin is likely dilutional.  Continue to monitor closely.  Hepatic steatosis Stable.  DVT Prophylaxis: Initiate Lovenox Code Status: Full code Family Communication: Discussed with patient and her husband Disposition Plan: Start mobilizing.  Hopefully return home when improved  Status is: Observation The patient will require care spanning > 2 midnights and should be moved to inpatient because: Acute pyelonephritis requiring IV antibiotics    Medications: Scheduled:  loratadine  10 mg Oral Daily   methocarbamol  1,000 mg Oral QID   Continuous:  sodium chloride 125 mL/hr at 08/30/22 0420   cefTRIAXone (ROCEPHIN)  IV     NTI:[id number] **OR** acetaminophen, diphenhydrAMINE, fentaNYL (SUBLIMAZE) injection, HYDROcodone-acetaminophen, metoCLOPramide (REGLAN) injection  Antibiotics: Anti-infectives (From admission, onward)    Start     Dose/Rate Route Frequency Ordered Stop   08/30/22 1700  cefTRIAXone (ROCEPHIN) 2 g in sodium chloride 0.9 % 100 mL IVPB        2 g 200 mL/hr over 30 Minutes Intravenous Every 24 hours 08/29/22 2249 09/06/22 1659   08/29/22 1715  cefTRIAXone (ROCEPHIN) 1 g in sodium chloride 0.9 % 100 mL IVPB        1 g 200 mL/hr over 30 Minutes Intravenous  Once 08/29/22 1706 08/29/22 1750       Objective:  Vital Signs  Vitals:   08/29/22 2100 08/29/22 2257  08/30/22 0251 08/30/22 0439  BP: 131/78 (!) 158/92 112/69 123/87  Pulse: 96 91 83 81  Resp: '16 18 18 18  '$ Temp: 97.7 F (36.5 C) (!) 97.5 F (36.4 C) 97.6 F (36.4 C) 98 F (36.7 C)  TempSrc: Oral Oral Oral Oral   SpO2: 96% 97% 98% 100%  Weight:      Height:        Intake/Output Summary (Last 24 hours) at 08/30/2022 0952 Last data filed at 08/30/2022 0300 Gross per 24 hour  Intake 1206.61 ml  Output --  Net 1206.61 ml   Filed Weights   08/29/22 1617  Weight: 72.6 kg    General appearance: Awake alert.  In no distress Resp: Clear to auscultation bilaterally.  Normal effort Cardio: S1-S2 is normal regular.  No S3-S4.  No rubs murmurs or bruit GI: Abdomen is soft.  Nontender nondistended.  Bowel sounds are present normal.  No masses organomegaly Tenderness over left costovertebral angle Extremities: No edema.  Full range of motion of lower extremities. Neurologic: Alert and oriented x3.  No focal neurological deficits.    Lab Results:  Data Reviewed: I have personally reviewed following labs and reports of the imaging studies  CBC: Recent Labs  Lab 08/29/22 1621 08/30/22 0429  WBC 12.2* 10.2  NEUTROABS 10.7* 7.4  HGB 12.5 10.8*  HCT 37.4 32.9*  MCV 86.8 92.4  PLT 246 403    Basic Metabolic Panel: Recent Labs  Lab 08/29/22 1621 08/29/22 1638 08/29/22 2309 08/30/22 0429  NA 132*  --  136 135  K 3.1*  --  3.6 3.3*  CL 98  --  105 107  CO2 20*  --  23 24  GLUCOSE 150*  --  161* 111*  BUN 9  --  7 7  CREATININE 0.69  --  0.62 0.66  CALCIUM 8.9  --  8.3* 7.9*  MG  --  1.5* 2.1 2.1  PHOS  --   --  2.9 2.9    GFR: Estimated Creatinine Clearance: 75.9 mL/min (by C-G formula based on SCr of 0.66 mg/dL).  Liver Function Tests: Recent Labs  Lab 08/29/22 1621 08/30/22 0429  AST 17 17  ALT 25 25  ALKPHOS 83 60  BILITOT 1.1 0.6  PROT 7.8 6.2*  ALBUMIN 4.2 3.0*     Coagulation Profile: Recent Labs  Lab 08/29/22 1621  INR 1.0    Cardiac Enzymes: Recent Labs  Lab 08/29/22 2309  CKTOTAL 134     Lipid Profile: Recent Labs    08/30/22 0429  CHOL 132  HDL 44  LDLCALC 71  TRIG 87  CHOLHDL 3.0    Thyroid Function Tests: Recent Labs    08/29/22 2309   TSH 1.361     Recent Results (from the past 240 hour(s))  Culture, blood (Routine x 2)     Status: None (Preliminary result)   Collection Time: 08/29/22  4:21 PM   Specimen: BLOOD LEFT ARM  Result Value Ref Range Status   Specimen Description   Final    BLOOD LEFT ARM Performed at [facility], [address], [place], [place] [address]    Special Requests   Final    BOTTLES DRAWN AEROBIC AND ANAEROBIC Blood Culture adequate volume Performed at [facility], [address] [place], [place] [address]    Culture   Final    NO GROWTH < 12 HOURS Performed at [facility], [place] [address], [place], [place] [address]  Report Status PENDING  Incomplete  Culture, blood (Routine x 2)     Status: None (Preliminary result)   Collection Time: 08/29/22  5:17 PM   Specimen: BLOOD  Result Value Ref Range Status   Specimen Description BLOOD SITE NOT SPECIFIED  Final   Special Requests   Final    BOTTLES DRAWN AEROBIC AND ANAEROBIC Blood Culture results may not be optimal due to an inadequate volume of blood received in culture bottles   Culture   Final    NO GROWTH < 12 HOURS Performed at [facility], [address], [place], [place] [address]    Report Status PENDING  Incomplete      Radiology Studies: CT Renal Stone Study  Result Date: 08/29/2022 CLINICAL DATA:  Flank pain, kidney stone suspected EXAM: CT ABDOMEN AND PELVIS WITHOUT CONTRAST TECHNIQUE: Multidetector CT imaging of the abdomen and pelvis was performed following the standard protocol without IV contrast. RADIATION DOSE REDUCTION: This exam was performed according to the departmental dose-optimization program which includes automated exposure control, adjustment of the mA and/or kV according to patient size and/or use of iterative reconstruction technique. COMPARISON:  CT 06/22/2021 FINDINGS: Lower chest: No acute airspace disease.  No pleural effusion. Hepatobiliary: The  liver is enlarged spanning 20.4 cm cranial caudal with advanced steatosis. Geographic fatty sparing in the subcapsular anterior liver. No discrete liver lesion on this unenhanced exam. Small calcified gallstone, series 2, image 29. No gallbladder inflammation. No biliary dilatation. Pancreas: No ductal dilatation or inflammation. Spleen: Normal in size without focal abnormality. Adrenals/Urinary Tract: Normal adrenal glands. Minimal left hydroureteronephrosis with ureter dilated to the bladder insertion. There are no renal ureteral calculi. Mild left perinephric edema. There is no evidence of focal renal abnormality or fluid collection. No right hydronephrosis or stone. Partially distended urinary bladder, no wall thickening or bladder stone. Stomach/Bowel: Stomach is within normal limits. Appendix appears normal. No evidence of bowel wall thickening, distention, or inflammatory changes. Moderate volume of colonic stool. Vascular/Lymphatic: Mild atherosclerosis of the aortoiliac bifurcation. No aneurysm. No adenopathy. Reproductive: Uterus and bilateral adnexa are unremarkable. Other: No free air, free fluid, or intra-abdominal fluid collection. Tiny fat containing umbilical hernia. Musculoskeletal: There are no acute or suspicious osseous abnormalities. 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 IMPRESSION: 1. Minimal left hydroureteronephrosis and perinephric edema without obstructing stone. Findings may be due to recently passed stone or urinary tract infection. 2. Hepatomegaly and advanced hepatic steatosis. 3. Cholelithiasis without gallbladder inflammation. Aortic Atherosclerosis (ICD10-I70.0). Electronically Signed   By: Keith Rake M.D.   On: 08/29/2022 18:10   DG Chest 2 View  Result Date: 08/29/2022 CLINICAL DATA:  Chest pain EXAM: CHEST - 2 VIEW COMPARISON:  Radiograph 12/16/2027 FINDINGS: Cardiomediastinal silhouette is within normal limits. There is no focal airspace consolidation. There is no pleural  effusion. No pneumothorax. There is and no acute osseous abnormality. Thoracic spondylosis. IMPRESSION: No evidence of acute cardiopulmonary disease. Electronically Signed   By: Maurine Simmering M.D.   On: 08/29/2022 17:00       LOS: 0 days   [facility] Pager on [email or web address]  08/30/2022, 9:52 AM

## 2022-08-30 NOTE — Progress Notes (Signed)
  Echocardiogram 2D Echocardiogram has been performed.  Tracey Wood 08/30/2022, 9:34 AM

## 2022-08-31 DIAGNOSIS — M0579 Rheumatoid arthritis with rheumatoid factor of multiple sites without organ or systems involvement: Secondary | ICD-10-CM | POA: Diagnosis present

## 2022-08-31 DIAGNOSIS — K760 Fatty (change of) liver, not elsewhere classified: Secondary | ICD-10-CM | POA: Diagnosis present

## 2022-08-31 DIAGNOSIS — Z79899 Other long term (current) drug therapy: Secondary | ICD-10-CM | POA: Diagnosis not present

## 2022-08-31 DIAGNOSIS — Z791 Long term (current) use of non-steroidal anti-inflammatories (NSAID): Secondary | ICD-10-CM | POA: Diagnosis not present

## 2022-08-31 DIAGNOSIS — E876 Hypokalemia: Secondary | ICD-10-CM | POA: Diagnosis present

## 2022-08-31 DIAGNOSIS — N12 Tubulo-interstitial nephritis, not specified as acute or chronic: Secondary | ICD-10-CM | POA: Diagnosis present

## 2022-08-31 DIAGNOSIS — R0600 Dyspnea, unspecified: Secondary | ICD-10-CM | POA: Diagnosis present

## 2022-08-31 DIAGNOSIS — K8020 Calculus of gallbladder without cholecystitis without obstruction: Secondary | ICD-10-CM | POA: Diagnosis present

## 2022-08-31 DIAGNOSIS — R059 Cough, unspecified: Secondary | ICD-10-CM | POA: Diagnosis present

## 2022-08-31 DIAGNOSIS — I10 Essential (primary) hypertension: Secondary | ICD-10-CM | POA: Diagnosis present

## 2022-08-31 DIAGNOSIS — R9431 Abnormal electrocardiogram [ECG] [EKG]: Secondary | ICD-10-CM | POA: Diagnosis present

## 2022-08-31 DIAGNOSIS — N136 Pyonephrosis: Secondary | ICD-10-CM | POA: Diagnosis present

## 2022-08-31 DIAGNOSIS — A419 Sepsis, unspecified organism: Secondary | ICD-10-CM | POA: Diagnosis present

## 2022-08-31 DIAGNOSIS — Z885 Allergy status to narcotic agent status: Secondary | ICD-10-CM | POA: Diagnosis not present

## 2022-08-31 DIAGNOSIS — I440 Atrioventricular block, first degree: Secondary | ICD-10-CM | POA: Diagnosis present

## 2022-08-31 DIAGNOSIS — N10 Acute tubulo-interstitial nephritis: Secondary | ICD-10-CM | POA: Diagnosis not present

## 2022-08-31 DIAGNOSIS — Z888 Allergy status to other drugs, medicaments and biological substances status: Secondary | ICD-10-CM | POA: Diagnosis not present

## 2022-08-31 DIAGNOSIS — R0789 Other chest pain: Secondary | ICD-10-CM | POA: Diagnosis present

## 2022-08-31 DIAGNOSIS — D649 Anemia, unspecified: Secondary | ICD-10-CM | POA: Diagnosis present

## 2022-08-31 DIAGNOSIS — Z803 Family history of malignant neoplasm of breast: Secondary | ICD-10-CM | POA: Diagnosis not present

## 2022-08-31 LAB — CBC
HCT: 37.7 % (ref 36.0–46.0)
Hemoglobin: 11.8 g/dL — ABNORMAL LOW (ref 12.0–15.0)
MCH: 29.6 pg (ref 26.0–34.0)
MCHC: 31.3 g/dL (ref 30.0–36.0)
MCV: 94.7 fL (ref 80.0–100.0)
Platelets: 188 10*3/uL (ref 150–400)
RBC: 3.98 MIL/uL (ref 3.87–5.11)
RDW: 13.8 % (ref 11.5–15.5)
WBC: 6.7 10*3/uL (ref 4.0–10.5)
nRBC: 0 % (ref 0.0–0.2)

## 2022-08-31 LAB — URINALYSIS, ROUTINE W REFLEX MICROSCOPIC
Bacteria, UA: NONE SEEN
Bilirubin Urine: NEGATIVE
Glucose, UA: NEGATIVE mg/dL
Ketones, ur: 20 mg/dL — AB
Leukocytes,Ua: NEGATIVE
Nitrite: NEGATIVE
Protein, ur: NEGATIVE mg/dL
Specific Gravity, Urine: 1.005 (ref 1.005–1.030)
pH: 7 (ref 5.0–8.0)

## 2022-08-31 LAB — BASIC METABOLIC PANEL
Anion gap: 8 (ref 5–15)
BUN: 6 mg/dL (ref 6–20)
CO2: 24 mmol/L (ref 22–32)
Calcium: 8.4 mg/dL — ABNORMAL LOW (ref 8.9–10.3)
Chloride: 106 mmol/L (ref 98–111)
Creatinine, Ser: 0.7 mg/dL (ref 0.44–1.00)
GFR, Estimated: >60 mL/min (ref >60)
Glucose, Bld: 104 mg/dL — ABNORMAL HIGH (ref 70–99)
Potassium: 3.5 mmol/L (ref 3.5–5.1)
Sodium: 138 mmol/L (ref 135–145)

## 2022-08-31 LAB — URINE CULTURE: Culture: NO GROWTH

## 2022-08-31 MED ORDER — BENAZEPRIL HCL 20 MG PO TABS
20.0000 mg | ORAL_TABLET | Freq: Every day | ORAL | Status: DC
  Administered 2022-08-31: 20 mg via ORAL
  Filled 2022-08-31 (×2): qty 1

## 2022-08-31 MED ORDER — AMLODIPINE BESYLATE 5 MG PO TABS
5.0000 mg | ORAL_TABLET | Freq: Every day | ORAL | Status: DC
  Administered 2022-08-31: 5 mg via ORAL
  Filled 2022-08-31 (×2): qty 1

## 2022-08-31 MED ORDER — POTASSIUM CHLORIDE CRYS ER 20 MEQ PO TBCR
40.0000 meq | EXTENDED_RELEASE_TABLET | Freq: Once | ORAL | Status: AC
Start: 2022-08-31 — End: 2022-08-31
  Administered 2022-08-31: 40 meq via ORAL
  Filled 2022-08-31: qty 2

## 2022-08-31 NOTE — Progress Notes (Signed)
Mobility Specialist - Progress Note    08/31/22 1518  Mobility  HOB Elevated/Bed Position Self regulated  Activity Ambulated independently in hallway  Range of Motion/Exercises Active  Level of Assistance Independent  Assistive Device None  Distance Ambulated (ft) 700 ft  Activity Response Tolerated well  $Mobility charge 1 Mobility   Pt was found in bed and agreeable to mobilize. Had no complaints and returned to chair at [place].  Ferd Hibbs Mobility Specialist

## 2022-08-31 NOTE — Progress Notes (Signed)
   08/30/22 2100  Assess: MEWS Score  Temp (!) 101.4 F (38.6 C)  BP (!) 157/85  MAP (mmHg) 106  Pulse Rate (!) 118  Resp 20  SpO2 97 %  Assess: MEWS Score  MEWS Temp 1  MEWS Systolic 0  MEWS Pulse 2  MEWS RR 0  MEWS LOC 0  MEWS Score 3  MEWS Score Color Yellow  Assess: if the MEWS score is Yellow or Red  Were vital signs taken at a resting state? Yes  Focused Assessment Change from prior assessment (see assessment flowsheet)  Does the patient meet 2 or more of the SIRS criteria? Yes  Does the patient have a confirmed or suspected source of infection? Yes  Provider and Rapid Response Notified? Yes  MEWS guidelines implemented *See Row Information* Yes  Treat  MEWS Interventions Administered scheduled meds/treatments  Take Vital Signs  Increase Vital Sign Frequency  Yellow: Q 2hr X 2 then Q 4hr X 2, if remains yellow, continue Q 4hrs  Escalate  MEWS: Escalate Yellow: discuss with charge nurse/RN and consider discussing with provider and RRT  Notify: Charge Nurse/RN  Name of Charge Nurse/RN Notified T Aletta Edmunds  Date Charge Nurse/RN Notified 08/30/22  Time Charge Nurse/RN Notified 2102  Notify: Provider  Provider Name/Title Olena Heckle  Date Provider Notified 08/30/22  Time Provider Notified 2105  Method of Notification Page  Notification Reason Change in status  Provider response No new orders;Other (Comment) (icepack/lowered toom temp/tylenol/incrase fluids)  Date of Provider Response 08/30/22  Time of Provider Response 2105  Document  Patient Outcome Stabilized after interventions  Assess: SIRS CRITERIA  SIRS Temperature  1  SIRS Pulse 1  SIRS Respirations  0  SIRS WBC 1  SIRS Score Sum  3

## 2022-08-31 NOTE — Plan of Care (Signed)
Discussed with patient plan of care for the evening, pain management and medications with some teach back displayed.  Patient prefers water with no ice.  Problem: Education: Goal: Knowledge of General Education information will improve Description: Including pain rating scale, medication(s)/side effects and non-pharmacologic comfort measures Outcome: Progressing   Problem: Health Behavior/Discharge Planning: Goal: Ability to manage health-related needs will improve Outcome: Progressing

## 2022-08-31 NOTE — Progress Notes (Addendum)
[facility] PROGRESS NOTE   Tracey Wood [id number] DOB: March 05, 1966 DOA: 08/29/2022  PCP: Martin[place], Betty G, MD  Brief History/Interval Summary: 56 year old female with past medical history of rheumatoid arthritis and hypertension who presented with fever chills nausea vomiting.  Found to have pyelonephritis.  Was hospitalized for further management.  Consultants: None  Procedures: None    Subjective/Interval History: Denies any chest pain this morning.  Abdominal pain has improved.  Did experience some shortness of breath with ambulation last night.  Occasional cough.    Assessment/Plan:  Acute pyelonephritis/sepsis, present on admission Patient had fever, leukocytosis and tachycardia at the time of admission. CT scan did show mild left-sided hydroureteronephrosis and perinephric edema.  No obstructing stones were noted.  No abscess. Continues to have episodes of fever.  WBC however is noted to be normal.  Surprisingly urine culture did not show any growth.  Blood cultures are still pending.  We will repeat UA and urine culture. Lactic acid level was normal.  Procalcitonin was elevated at 4.65.  Recheck procalcitonin level tomorrow. Continue with ceftriaxone..  Cough with dyspnea CT angiogram did not show any PE.  However there was some concern for interstitial edema.  IV fluids were discontinued yesterday.  She is saturating normal on room air.  Continue to monitor for now.  We will hold off on diuretics unless her symptoms were to get worse.  Echocardiogram showed normal systolic function.  History of rheumatoid arthritis Stable.  On Orencia once a week.  Currently on hold due to active infection.  Essential hypertension/hypokalemia/hypomagnesemia Takes amlodipine/benazepril prior to admission.  Currently on hold.   Blood pressure is noted to be in the hypertensive range.  We will resume her amlodipine.  Hypokalemia/hypomagnesemia Continue to replete  potassium.  Magnesium was 2.1 yesterday.  Chest pain Atypical and was in the setting of IV potassium administration.  Had recurrence of chest pain yesterday afternoon.  EKG only showed sinus tachycardia.  D-dimer was elevated but CT angiogram negative for PE.  Troponins were normal.  Echocardiogram showed normal left ventricular and right ventricular function without any wall motion abnormalities.  Patient reassured.  Since there was an element of interstitial edema her IV fluids were discontinued.  Continue to monitor closely.    QT prolongation Monitor on telemetry  Normocytic anemia No evidence of overt bleeding.  Drop in hemoglobin is likely dilutional.  Continue to monitor closely.  Hepatic steatosis/cholelithiasis Stable.  DVT Prophylaxis: Lovenox Code Status: Full code Family Communication: Discussed with patient and her husband Disposition Plan: Start mobilizing.  Hopefully return home when improved  Status is: Inpatient Remains inpatient appropriate because: Acute pyelonephritis     Medications: Scheduled:  enoxaparin (LOVENOX) injection  40 mg Subcutaneous Q24H   loratadine  10 mg Oral Daily   methocarbamol  1,000 mg Oral QID   Continuous:  cefTRIAXone (ROCEPHIN)  IV 2 g (08/30/22 1802)   QAS:[id number] **OR** acetaminophen, diphenhydrAMINE, fentaNYL (SUBLIMAZE) injection, HYDROcodone-acetaminophen, prochlorperazine  Antibiotics: Anti-infectives (From admission, onward)    Start     Dose/Rate Route Frequency Ordered Stop   08/30/22 1700  cefTRIAXone (ROCEPHIN) 2 g in sodium chloride 0.9 % 100 mL IVPB        2 g 200 mL/hr over 30 Minutes Intravenous Every 24 hours 08/29/22 2249 09/06/22 1659   08/29/22 1715  cefTRIAXone (ROCEPHIN) 1 g in sodium chloride 0.9 % 100 mL IVPB        1 g 200 mL/hr over 30 Minutes Intravenous  Once  08/29/22 1706 08/29/22 1750       Objective:  Vital Signs  Vitals:   08/30/22 2327 08/31/22 0103 08/31/22 0517 08/31/22 0912   BP: 138/69 135/72 (!) 155/88 (!) 164/82  Pulse: (!) 104 90 (!) 104 (!) 108  Resp:  '18 18 17  '$ Temp: 98.6 F (37 C)  97.6 F (36.4 C) 99.5 F (37.5 C)  TempSrc: Oral Oral Oral Oral  SpO2: 96% 98% 96% 97%  Weight:      Height:        Intake/Output Summary (Last 24 hours) at 08/31/2022 1022 Last data filed at 08/31/2022 0500 Gross per 24 hour  Intake 774.93 ml  Output --  Net 774.93 ml    Filed Weights   08/29/22 1617  Weight: 72.6 kg    General appearance: Awake alert.  In no distress Resp: Normal effort at rest.  Very fine crackles at the bases.  No wheezing or rhonchi. Cardio: S1-S2 is normal regular.  No S3-S4.  No rubs murmurs or bruit GI: Abdomen is soft.  Mildly tender on the left.  No rebound rigidity or guarding.  No masses organomegaly. Extremities: No edema.  Full range of motion of lower extremities. Neurologic: Alert and oriented x3.  No focal neurological deficits.     Lab Results:  Data Reviewed: I have personally reviewed following labs and reports of the imaging studies  CBC: Recent Labs  Lab 08/29/22 1621 08/30/22 0429 08/31/22 0526  WBC 12.2* 10.2 6.7  NEUTROABS 10.7* 7.4  --   HGB 12.5 10.8* 11.8*  HCT 37.4 32.9* 37.7  MCV 86.8 92.4 94.7  PLT 246 196 188     Basic Metabolic Panel: Recent Labs  Lab 08/29/22 1621 08/29/22 1638 08/29/22 2309 08/30/22 0429 08/31/22 0526  NA 132*  --  136 135 138  K 3.1*  --  3.6 3.3* 3.5  CL 98  --  105 107 106  CO2 20*  --  '23 24 24  '$ GLUCOSE 150*  --  161* 111* 104*  BUN 9  --  '7 7 6  '$ CREATININE 0.69  --  0.62 0.66 0.70  CALCIUM 8.9  --  8.3* 7.9* 8.4*  MG  --  1.5* 2.1 2.1  --   PHOS  --   --  2.9 2.9  --      GFR: Estimated Creatinine Clearance: 75.9 mL/min (by C-G formula based on SCr of 0.7 mg/dL).  Liver Function Tests: Recent Labs  Lab 08/29/22 1621 08/30/22 0429  AST 17 17  ALT 25 25  ALKPHOS 83 60  BILITOT 1.1 0.6  PROT 7.8 6.2*  ALBUMIN 4.2 3.0*      Coagulation  Profile: Recent Labs  Lab 08/29/22 1621  INR 1.0     Cardiac Enzymes: Recent Labs  Lab 08/29/22 2309  CKTOTAL 134      Lipid Profile: Recent Labs    08/30/22 0429  CHOL 132  HDL 44  LDLCALC 71  TRIG 87  CHOLHDL 3.0     Thyroid Function Tests: Recent Labs    08/29/22 2309  TSH 1.361      Recent Results (from the past 240 hour(s))  Culture, blood (Routine x 2)     Status: None (Preliminary result)   Collection Time: 08/29/22  4:21 PM   Specimen: BLOOD LEFT ARM  Result Value Ref Range Status   Specimen Description   Final    BLOOD LEFT ARM Performed at [facility], [place],  [place], [place] [address]    Special Requests   Final    BOTTLES DRAWN AEROBIC AND ANAEROBIC Blood Culture adequate volume Performed at [facility], [address], [place], [place] [address]    Culture   Final    NO GROWTH 2 DAYS Performed at [facility], [place] [address] [place], [place] [address]    Report Status PENDING  Incomplete  Culture, blood (Routine x 2)     Status: None (Preliminary result)   Collection Time: 08/29/22  5:17 PM   Specimen: BLOOD  Result Value Ref Range Status   Specimen Description BLOOD SITE NOT SPECIFIED  Final   Special Requests   Final    BOTTLES DRAWN AEROBIC AND ANAEROBIC Blood Culture results may not be optimal due to an inadequate volume of blood received in culture bottles   Culture   Final    NO GROWTH 2 DAYS Performed at [facility], [place] [address] [place], [place] [address]    Report Status PENDING  Incomplete  Urine Culture     Status: None   Collection Time: 08/29/22 11:00 PM   Specimen: Urine, Clean Catch  Result Value Ref Range Status   Specimen Description   Final    URINE, CLEAN CATCH Performed at [facility], [place] [address], [place], [place] [address]    Special Requests   Final    NONE Performed at [facility], [place]  [address], [place], [place] [address]    Culture   Final    NO GROWTH Performed at [facility], [place] [address], [place], [place] [address]    Report Status 08/31/2022 FINAL  Final      Radiology Studies: CT Angio Chest Pulmonary Embolism (PE) W or WO Contrast  Result Date: 08/30/2022 CLINICAL DATA:  Atypical chest pain. Positive D-dimer. High probability for pulmonary embolism. EXAM: CT ANGIOGRAPHY CHEST WITH CONTRAST TECHNIQUE: Multidetector CT imaging of the chest was performed using the standard protocol during bolus administration of intravenous contrast. Multiplanar CT image reconstructions and MIPs were obtained to evaluate the vascular anatomy. RADIATION DOSE REDUCTION: This exam was performed according to the departmental dose-optimization program which includes automated exposure control, adjustment of the mA and/or kV according to patient size and/or use of iterative reconstruction technique. CONTRAST:  75m OMNIPAQUE IOHEXOL 350 MG/ML SOLN COMPARISON:  03/07/2015 FINDINGS: Cardiovascular: Satisfactory opacification of pulmonary arteries noted, and no pulmonary emboli identified. No evidence of thoracic aortic dissection or aneurysm. Mediastinum/Nodes: No masses or pathologically enlarged lymph nodes identified. Lungs/Pleura: Diffuse thickening of pulmonary interlobular septi is suspicious for interstitial edema. No evidence of pulmonary consolidation or mass. Tiny bilateral pleural effusions and mild bibasilar atelectasis are also noted. Upper abdomen: Diffuse hepatic steatosis again noted. Musculoskeletal: No suspicious bone lesions identified. Review of the MIP images confirms the above findings. IMPRESSION: No evidence of pulmonary embolism. Diffuse thickening of pulmonary interlobular septi, suspicious for interstitial edema. Tiny bilateral pleural effusions and mild bibasilar atelectasis. Hepatic steatosis. Electronically Signed   By: JMarlaine HindM.D.   On: 08/30/2022 17:17    ECHOCARDIOGRAM COMPLETE  Result Date: 08/30/2022    ECHOCARDIOGRAM REPORT   Patient Name:   LTOMOKO SANDRADate of Exam: 08/30/2022 Medical Rec #:  [id number]             Height:       62.0 in Accession #:    [id number]  Weight:       160.0 lb Date of Birth:  1966-08-24              BSA:          1.739 m Patient Age:    23 years               BP:           123/87 mmHg Patient Gender: F                      HR:           78 bpm. Exam Location:  Inpatient Procedure: 2D Echo, 3D Echo, Cardiac Doppler and Color Doppler Indications:    R07.9* Chest pain, unspecified  History:        Patient has no prior history of Echocardiogram examinations.                 Abnormal ECG, Signs/Symptoms:Chest Pain and Fever; Risk                 Factors:Hypertension, Dyslipidemia and Current Smoker.  Sonographer:    Roseanna Rainbow RDCS Referring Phys: [place]  1. Left ventricular ejection fraction by 3D volume is 61 %. The left ventricle has normal function. The left ventricle has no regional wall motion abnormalities. Left ventricular diastolic parameters were low normal for age.  2. Right ventricular systolic function is normal. The right ventricular size is normal. There is normal pulmonary artery systolic pressure. The estimated right ventricular systolic pressure is 24.0 mmHg.  3. The mitral valve is grossly normal. Mild mitral valve regurgitation. No evidence of mitral stenosis.  4. The aortic valve is grossly normal. Aortic valve regurgitation is trivial. No aortic stenosis is present.  5. The inferior vena cava is normal in size with <50% respiratory variability, suggesting right atrial pressure of 8 mmHg. FINDINGS  Left Ventricle: Left ventricular ejection fraction by 3D volume is 61 %. The left ventricle has normal function. The left ventricle has no regional wall motion abnormalities. The left ventricular internal cavity size was normal in size. There is no left  ventricular  hypertrophy. Left ventricular diastolic parameters were normal. Right Ventricle: The right ventricular size is normal. No increase in right ventricular wall thickness. Right ventricular systolic function is normal. There is normal pulmonary artery systolic pressure. The tricuspid regurgitant velocity is 2.12 m/s, and  with an assumed right atrial pressure of 8 mmHg, the estimated right ventricular systolic pressure is 97.3 mmHg. Left Atrium: Left atrial size was normal in size. Right Atrium: Right atrial size was normal in size. Pericardium: There is no evidence of pericardial effusion. Mitral Valve: The mitral valve is grossly normal. Mild mitral valve regurgitation. No evidence of mitral valve stenosis. Tricuspid Valve: The tricuspid valve is normal in structure. Tricuspid valve regurgitation is trivial. No evidence of tricuspid stenosis. Aortic Valve: The aortic valve is grossly normal. Aortic valve regurgitation is trivial. No aortic stenosis is present. Pulmonic Valve: The pulmonic valve was grossly normal. Pulmonic valve regurgitation is trivial. No evidence of pulmonic stenosis. Aorta: The aortic root is normal in size and structure. Venous: The inferior vena cava is normal in size with less than 50% respiratory variability, suggesting right atrial pressure of 8 mmHg. IAS/Shunts: No atrial level shunt detected by color flow Doppler.  LEFT VENTRICLE PLAX 2D LVIDd:         4.40 cm  Diastology LVIDs:         2.50 cm         LV e' medial:    6.96 cm/s LV PW:         0.90 cm         LV E/e' medial:  15.8 LV IVS:        0.90 cm         LV e' lateral:   8.81 cm/s LVOT diam:     1.80 cm         LV E/e' lateral: 12.5 LV SV:         52 LV SV Index:   30 LVOT Area:     2.54 cm        3D Volume EF                                LV 3D EF:    Left                                             ventricul LV Volumes (MOD)                            ar LV vol d, MOD    88.6 ml                    ejection A2C:                                         fraction LV vol d, MOD    86.3 ml                    by 3D A4C:                                        volume is LV vol s, MOD    36.7 ml                    61 %. A2C: LV vol s, MOD    29.9 ml A4C:                           3D Volume EF: LV SV MOD A2C:   51.9 ml       3D EF:        61 % LV SV MOD A4C:   86.3 ml       LV EDV:       105 ml LV SV MOD BP:    54.5 ml       LV ESV:       41 ml                                LV SV:        64 ml RIGHT VENTRICLE             IVC RV S prime:     11.40  cm/s  IVC diam: 2.90 cm TAPSE (M-mode): 2.5 cm LEFT ATRIUM             Index        RIGHT ATRIUM           Index LA diam:        3.50 cm 2.01 cm/m   RA Area:     12.03 cm LA Vol (A2C):   45.1 ml 25.94 ml/m  RA Volume:   24.53 ml  14.11 ml/m LA Vol (A4C):   36.3 ml 20.88 ml/m LA Biplane Vol: 40.9 ml 23.52 ml/m  AORTIC VALVE LVOT Vmax:   97.90 cm/s LVOT Vmean:  66.200 cm/s LVOT VTI:    0.206 m  AORTA Ao Root diam: 2.70 cm Ao Asc diam:  2.90 cm MITRAL VALVE                TRICUSPID VALVE MV Area (PHT): 3.99 cm     TR Peak grad:   18.0 mmHg MV Decel Time: 190 msec     TR Vmax:        212.00 cm/s MV E velocity: 110.00 cm/s MV A velocity: 107.25 cm/s  SHUNTS MV E/A ratio:  1.03         Systemic VTI:  0.21 m                             Systemic Diam: 1.80 cm Cherlynn Kaiser MD Electronically signed by Cherlynn Kaiser MD Signature Date/Time: 08/30/2022/11:58:37 AM    Final    CT Renal Stone Study  Result Date: 08/29/2022 CLINICAL DATA:  Flank pain, kidney stone suspected EXAM: CT ABDOMEN AND PELVIS WITHOUT CONTRAST TECHNIQUE: Multidetector CT imaging of the abdomen and pelvis was performed following the standard protocol without IV contrast. RADIATION DOSE REDUCTION: This exam was performed according to the departmental dose-optimization program which includes automated exposure control, adjustment of the mA and/or kV according to patient size and/or use of iterative reconstruction technique.  COMPARISON:  CT 06/22/2021 FINDINGS: Lower chest: No acute airspace disease.  No pleural effusion. Hepatobiliary: The liver is enlarged spanning 20.4 cm cranial caudal with advanced steatosis. Geographic fatty sparing in the subcapsular anterior liver. No discrete liver lesion on this unenhanced exam. Small calcified gallstone, series 2, image 29. No gallbladder inflammation. No biliary dilatation. Pancreas: No ductal dilatation or inflammation. Spleen: Normal in size without focal abnormality. Adrenals/Urinary Tract: Normal adrenal glands. Minimal left hydroureteronephrosis with ureter dilated to the bladder insertion. There are no renal ureteral calculi. Mild left perinephric edema. There is no evidence of focal renal abnormality or fluid collection. No right hydronephrosis or stone. Partially distended urinary bladder, no wall thickening or bladder stone. Stomach/Bowel: Stomach is within normal limits. Appendix appears normal. No evidence of bowel wall thickening, distention, or inflammatory changes. Moderate volume of colonic stool. Vascular/Lymphatic: Mild atherosclerosis of the aortoiliac bifurcation. No aneurysm. No adenopathy. Reproductive: Uterus and bilateral adnexa are unremarkable. Other: No free air, free fluid, or intra-abdominal fluid collection. Tiny fat containing umbilical hernia. Musculoskeletal: There are no acute or suspicious osseous abnormalities. 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 IMPRESSION: 1. Minimal left hydroureteronephrosis and perinephric edema without obstructing stone. Findings may be due to recently passed stone or urinary tract infection. 2. Hepatomegaly and advanced hepatic steatosis. 3. Cholelithiasis without gallbladder inflammation. Aortic Atherosclerosis (ICD10-I70.0). Electronically Signed   By: Keith Rake M.D.   On: 08/29/2022 18:10   DG Chest 2 View  Result Date: 08/29/2022 CLINICAL DATA:  Chest pain EXAM: CHEST - 2 VIEW COMPARISON:  Radiograph 12/16/2027 FINDINGS:  Cardiomediastinal silhouette is within normal limits. There is no focal airspace consolidation. There is no pleural effusion. No pneumothorax. There is and no acute osseous abnormality. Thoracic spondylosis. IMPRESSION: No evidence of acute cardiopulmonary disease. Electronically Signed   By: Maurine Simmering M.D.   On: 08/29/2022 17:00       LOS: 0 days   [facility] Pager on [email or web address]  08/31/2022, 10:22 AM

## 2022-09-01 ENCOUNTER — Telehealth: Payer: Self-pay

## 2022-09-01 DIAGNOSIS — K760 Fatty (change of) liver, not elsewhere classified: Secondary | ICD-10-CM | POA: Diagnosis not present

## 2022-09-01 DIAGNOSIS — N12 Tubulo-interstitial nephritis, not specified as acute or chronic: Secondary | ICD-10-CM | POA: Diagnosis not present

## 2022-09-01 DIAGNOSIS — N10 Acute tubulo-interstitial nephritis: Secondary | ICD-10-CM | POA: Diagnosis not present

## 2022-09-01 DIAGNOSIS — I10 Essential (primary) hypertension: Secondary | ICD-10-CM | POA: Diagnosis not present

## 2022-09-01 LAB — CBC
HCT: 35.5 % — ABNORMAL LOW (ref 36.0–46.0)
Hemoglobin: 11.4 g/dL — ABNORMAL LOW (ref 12.0–15.0)
MCH: 29.2 pg (ref 26.0–34.0)
MCHC: 32.1 g/dL (ref 30.0–36.0)
MCV: 91 fL (ref 80.0–100.0)
Platelets: 218 10*3/uL (ref 150–400)
RBC: 3.9 MIL/uL (ref 3.87–5.11)
RDW: 13.5 % (ref 11.5–15.5)
WBC: 5.9 10*3/uL (ref 4.0–10.5)
nRBC: 0 % (ref 0.0–0.2)

## 2022-09-01 LAB — URINE CULTURE: Culture: NO GROWTH

## 2022-09-01 LAB — PROCALCITONIN: Procalcitonin: 2.64 ng/mL

## 2022-09-01 LAB — COMPREHENSIVE METABOLIC PANEL
ALT: 34 U/L (ref 0–44)
AST: 29 U/L (ref 15–41)
Albumin: 3.1 g/dL — ABNORMAL LOW (ref 3.5–5.0)
Alkaline Phosphatase: 70 U/L (ref 38–126)
Anion gap: 7 (ref 5–15)
BUN: 8 mg/dL (ref 6–20)
CO2: 25 mmol/L (ref 22–32)
Calcium: 8.6 mg/dL — ABNORMAL LOW (ref 8.9–10.3)
Chloride: 105 mmol/L (ref 98–111)
Creatinine, Ser: 0.71 mg/dL (ref 0.44–1.00)
GFR, Estimated: >60 mL/min (ref >60)
Glucose, Bld: 99 mg/dL (ref 70–99)
Potassium: 3.5 mmol/L (ref 3.5–5.1)
Sodium: 137 mmol/L (ref 135–145)
Total Bilirubin: 0.4 mg/dL (ref 0.3–1.2)
Total Protein: 7 g/dL (ref 6.5–8.1)

## 2022-09-01 MED ORDER — CEPHALEXIN 500 MG PO CAPS: 500.0000 mg | ORAL_CAPSULE | Freq: Two times a day (BID) | ORAL | 0 refills | Status: AC

## 2022-09-01 NOTE — Telephone Encounter (Signed)
Transition Care Management Unsuccessful Follow-up Telephone Call  Date of discharge and from where:  09/01/22 from [facility] for pyelonephritis   Attempts:  1st Attempt  Reason for unsuccessful TCM follow-up call:  Left voice message  If patient returns call please transfer to [facility]

## 2022-09-02 NOTE — Discharge Summary (Signed)
Physician Discharge Summary   Patient: Tracey Wood MRN: [id number] DOB: 1966-07-09  Admit date:     08/29/2022  Discharge date: 09/01/2022  Discharge Physician: Hosie Poisson   PCP: Martinique, Betty G, MD   Recommendations at discharge:  Please follow up with PCP in one week.  Please check US renal in 2 to 4 weeks.  Please follow up with urine culture report.   Discharge Diagnoses: Principal Problem:   Acute pyelonephritis Active Problems:   Hypertension, essential, benign   Rheumatoid arthritis involving multiple sites with positive rheumatoid factor (HCC)   Hepatic steatosis   Sepsis (HCC)   Prolonged QT interval   Hypokalemia   Hypomagnesemia   Chest pain  Hospital Course:  56 year old female with past medical history of rheumatoid arthritis and hypertension who presented with fever chills nausea vomiting.  Found to have pyelonephritis.  Was hospitalized for further management. Assessment and Plan:  Acute pyelonephritis/sepsis, present on admission Patient had fever, leukocytosis and tachycardia at the time of admission. CT scan did show mild left-sided hydroureteronephrosis and perinephric edema.  No obstructing stones were noted.  No abscess.  WBC however is noted to be normal.  Surprisingly urine culture did not show any growth.  Blood cultures are still pending.  Lactic acid level was normal.  improving pro calcitonin. She reports feeling much better, no fevers in the last 24 hours, . She was discharged on oral antibiotics and recommended to follow up with PCP in one week before the antibiotics are completed and recommended to get renal US in 2 to 4 weeks to evaluate for resolution of the hydronephrosis.    Cough with dyspnea CT angiogram did not show any PE.  However there was some concern for interstitial edema.  IV fluids were discontinued .  She is saturating normal on room air.  Continue to monitor for now.  We will hold off on diuretics unless her symptoms were  to get worse.  Echocardiogram showed normal systolic function.   History of rheumatoid arthritis Stable.  On Orencia once a week.  Currently on hold due to active infection.   Essential hypertension/hypokalemia/hypomagnesemia Restarted her home meds on discharge.    Hypokalemia/hypomagnesemia Continue to replete potassium.  Magnesium was 2.1 yesterday.   Chest pain Atypical and was in the setting of IV potassium administration.    EKG only showed sinus tachycardia.  D-dimer was elevated but CT angiogram negative for PE.  Troponins were normal.  Echocardiogram showed normal left ventricular and right ventricular function without any wall motion abnormalities.  Patient reassured.  Since there was an element of interstitial edema her IV fluids were discontinued.  Continue to monitor closely.     QT prolongation Monitor on telemetry   Normocytic anemia No evidence of overt bleeding.  Drop in hemoglobin is likely dilutional.  Continue to monitor closely.   Hepatic steatosis/cholelithiasis Stable.      Consultants: none.  Procedures performed: none.   Disposition: Home Diet recommendation:  Discharge Diet Orders (From admission, onward)     Start     Ordered   09/01/22 0000  Diet - low sodium heart healthy        09/01/22 0914           Regular diet DISCHARGE MEDICATION: Allergies as of 09/01/2022       Reactions   Morphine And Related Other (See Comments)   Fast heart beat   Ciprofloxacin Rash   Oxycodone Palpitations   Tape Dermatitis  Medication List     STOP taking these medications    meloxicam 15 MG tablet Commonly known as: MOBIC       TAKE these medications    acetaminophen 500 MG tablet Commonly known as: TYLENOL Take 1,000 mg by mouth daily as needed for moderate pain.   amLODipine-benazepril 5-20 MG capsule Commonly known as: LOTREL TAKE 1 CAPSULE BY MOUTH EVERY DAY What changed: how much to take   cephALEXin 500 MG  capsule Commonly known as: KEFLEX Take 1 capsule (500 mg total) by mouth 2 (two) times daily for 7 days.   methocarbamol 500 MG tablet Commonly known as: ROBAXIN Take 2 tablets (1,000 mg total) by mouth 4 (four) times daily.   Orencia ClickJect 387 MG/ML Soaj Generic drug: Abatacept Inject 1 Syringe into the skin once a week. Sunday        Follow-up Information     Martinique, Betty G, MD. Schedule an appointment as soon as possible for a visit in 1 week(s).   Specialty: Family Medicine Contact information: [place] [address] [phone]                Discharge Exam: Filed Weights   08/29/22 1617  Weight: 72.6 kg   General exam: Appears calm and comfortable  Respiratory system: Clear to auscultation. Respiratory effort normal. Cardiovascular system: S1 & S2 heard, RRR. No JVD, murmurs, rubs, gallops or clicks. No pedal edema. Gastrointestinal system: Abdomen is nondistended, soft and nontender. No organomegaly or masses felt. Normal bowel sounds heard. Central nervous system: Alert and oriented. No focal neurological deficits. Extremities: Symmetric 5 x 5 power. Skin: No rashes, lesions or ulcers Psychiatry: Judgement and insight appear normal. Mood & affect appropriate.    Condition at discharge: fair  The results of significant diagnostics from this hospitalization (including imaging, microbiology, ancillary and laboratory) are listed below for reference.   Imaging Studies: CT Angio Chest Pulmonary Embolism (PE) W or WO Contrast  Result Date: 08/30/2022 CLINICAL DATA:  Atypical chest pain. Positive D-dimer. High probability for pulmonary embolism. EXAM: CT ANGIOGRAPHY CHEST WITH CONTRAST TECHNIQUE: Multidetector CT imaging of the chest was performed using the standard protocol during bolus administration of intravenous contrast. Multiplanar CT image reconstructions and MIPs were obtained to evaluate the vascular anatomy. RADIATION DOSE  REDUCTION: This exam was performed according to the departmental dose-optimization program which includes automated exposure control, adjustment of the mA and/or kV according to patient size and/or use of iterative reconstruction technique. CONTRAST:  73m OMNIPAQUE IOHEXOL 350 MG/ML SOLN COMPARISON:  03/07/2015 FINDINGS: Cardiovascular: Satisfactory opacification of pulmonary arteries noted, and no pulmonary emboli identified. No evidence of thoracic aortic dissection or aneurysm. Mediastinum/Nodes: No masses or pathologically enlarged lymph nodes identified. Lungs/Pleura: Diffuse thickening of pulmonary interlobular septi is suspicious for interstitial edema. No evidence of pulmonary consolidation or mass. Tiny bilateral pleural effusions and mild bibasilar atelectasis are also noted. Upper abdomen: Diffuse hepatic steatosis again noted. Musculoskeletal: No suspicious bone lesions identified. Review of the MIP images confirms the above findings. IMPRESSION: No evidence of pulmonary embolism. Diffuse thickening of pulmonary interlobular septi, suspicious for interstitial edema. Tiny bilateral pleural effusions and mild bibasilar atelectasis. Hepatic steatosis. Electronically Signed   By: JMarlaine HindM.D.   On: 08/30/2022 17:17   ECHOCARDIOGRAM COMPLETE  Result Date: 08/30/2022    ECHOCARDIOGRAM REPORT   Patient Name:   LTERRILYNN POSTELLDate of Exam: 08/30/2022 Medical Rec #:  [id number]  Height:       62.0 in Accession #:    [id number]             Weight:       160.0 lb Date of Birth:  [birth year]              BSA:          1.739 m Patient Age:    56 years               BP:           123/87 mmHg Patient Gender: F                      HR:           78 bpm. Exam Location:  Inpatient Procedure: 2D Echo, 3D Echo, Cardiac Doppler and Color Doppler Indications:    R07.9* Chest pain, unspecified  History:        Patient has no prior history of Echocardiogram examinations.                 Abnormal ECG,  Signs/Symptoms:Chest Pain and Fever; Risk                 Factors:Hypertension, Dyslipidemia and Current Smoker.  Sonographer:    Roseanna Rainbow RDCS Referring Phys: [place]  1. Left ventricular ejection fraction by 3D volume is 61 %. The left ventricle has normal function. The left ventricle has no regional wall motion abnormalities. Left ventricular diastolic parameters were low normal for age.  2. Right ventricular systolic function is normal. The right ventricular size is normal. There is normal pulmonary artery systolic pressure. The estimated right ventricular systolic pressure is 37.6 mmHg.  3. The mitral valve is grossly normal. Mild mitral valve regurgitation. No evidence of mitral stenosis.  4. The aortic valve is grossly normal. Aortic valve regurgitation is trivial. No aortic stenosis is present.  5. The inferior vena cava is normal in size with <50% respiratory variability, suggesting right atrial pressure of 8 mmHg. FINDINGS  Left Ventricle: Left ventricular ejection fraction by 3D volume is 61 %. The left ventricle has normal function. The left ventricle has no regional wall motion abnormalities. The left ventricular internal cavity size was normal in size. There is no left  ventricular hypertrophy. Left ventricular diastolic parameters were normal. Right Ventricle: The right ventricular size is normal. No increase in right ventricular wall thickness. Right ventricular systolic function is normal. There is normal pulmonary artery systolic pressure. The tricuspid regurgitant velocity is 2.12 m/s, and  with an assumed right atrial pressure of 8 mmHg, the estimated right ventricular systolic pressure is 28.3 mmHg. Left Atrium: Left atrial size was normal in size. Right Atrium: Right atrial size was normal in size. Pericardium: There is no evidence of pericardial effusion. Mitral Valve: The mitral valve is grossly normal. Mild mitral valve regurgitation. No evidence of mitral valve  stenosis. Tricuspid Valve: The tricuspid valve is normal in structure. Tricuspid valve regurgitation is trivial. No evidence of tricuspid stenosis. Aortic Valve: The aortic valve is grossly normal. Aortic valve regurgitation is trivial. No aortic stenosis is present. Pulmonic Valve: The pulmonic valve was grossly normal. Pulmonic valve regurgitation is trivial. No evidence of pulmonic stenosis. Aorta: The aortic root is normal in size and structure. Venous: The inferior vena cava is normal in size with less than 50% respiratory variability, suggesting right atrial pressure of 8 mmHg. IAS/Shunts: No atrial level  shunt detected by color flow Doppler.  LEFT VENTRICLE PLAX 2D LVIDd:         4.40 cm         Diastology LVIDs:         2.50 cm         LV e' medial:    6.96 cm/s LV PW:         0.90 cm         LV E/e' medial:  15.8 LV IVS:        0.90 cm         LV e' lateral:   8.81 cm/s LVOT diam:     1.80 cm         LV E/e' lateral: 12.5 LV SV:         52 LV SV Index:   30 LVOT Area:     2.54 cm        3D Volume EF                                LV 3D EF:    Left                                             ventricul LV Volumes (MOD)                            ar LV vol d, MOD    88.6 ml                    ejection A2C:                                        fraction LV vol d, MOD    86.3 ml                    by 3D A4C:                                        volume is LV vol s, MOD    36.7 ml                    61 %. A2C: LV vol s, MOD    29.9 ml A4C:                           3D Volume EF: LV SV MOD A2C:   51.9 ml       3D EF:        61 % LV SV MOD A4C:   86.3 ml       LV EDV:       105 ml LV SV MOD BP:    54.5 ml       LV ESV:       41 ml                                LV SV:  64 ml RIGHT VENTRICLE             IVC RV S prime:     11.40 cm/s  IVC diam: 2.90 cm TAPSE (M-mode): 2.5 cm LEFT ATRIUM             Index        RIGHT ATRIUM           Index LA diam:        3.50 cm 2.01 cm/m   RA Area:     12.03 cm LA Vol  (A2C):   45.1 ml 25.94 ml/m  RA Volume:   24.53 ml  14.11 ml/m LA Vol (A4C):   36.3 ml 20.88 ml/m LA Biplane Vol: 40.9 ml 23.52 ml/m  AORTIC VALVE LVOT Vmax:   97.90 cm/s LVOT Vmean:  66.200 cm/s LVOT VTI:    0.206 m  AORTA Ao Root diam: 2.70 cm Ao Asc diam:  2.90 cm MITRAL VALVE                TRICUSPID VALVE MV Area (PHT): 3.99 cm     TR Peak grad:   18.0 mmHg MV Decel Time: 190 msec     TR Vmax:        212.00 cm/s MV E velocity: 110.00 cm/s MV A velocity: 107.25 cm/s  SHUNTS MV E/A ratio:  1.03         Systemic VTI:  0.21 m                             Systemic Diam: 1.80 cm Cherlynn Kaiser MD Electronically signed by Cherlynn Kaiser MD Signature Date/Time: 08/30/2022/11:58:37 AM    Final    CT Renal Stone Study  Result Date: 08/29/2022 CLINICAL DATA:  Flank pain, kidney stone suspected EXAM: CT ABDOMEN AND PELVIS WITHOUT CONTRAST TECHNIQUE: Multidetector CT imaging of the abdomen and pelvis was performed following the standard protocol without IV contrast. RADIATION DOSE REDUCTION: This exam was performed according to the departmental dose-optimization program which includes automated exposure control, adjustment of the mA and/or kV according to patient size and/or use of iterative reconstruction technique. COMPARISON:  CT 06/22/2021 FINDINGS: Lower chest: No acute airspace disease.  No pleural effusion. Hepatobiliary: The liver is enlarged spanning 20.4 cm cranial caudal with advanced steatosis. Geographic fatty sparing in the subcapsular anterior liver. No discrete liver lesion on this unenhanced exam. Small calcified gallstone, series 2, image 29. No gallbladder inflammation. No biliary dilatation. Pancreas: No ductal dilatation or inflammation. Spleen: Normal in size without focal abnormality. Adrenals/Urinary Tract: Normal adrenal glands. Minimal left hydroureteronephrosis with ureter dilated to the bladder insertion. There are no renal ureteral calculi. Mild left perinephric edema. There is no  evidence of focal renal abnormality or fluid collection. No right hydronephrosis or stone. Partially distended urinary bladder, no wall thickening or bladder stone. Stomach/Bowel: Stomach is within normal limits. Appendix appears normal. No evidence of bowel wall thickening, distention, or inflammatory changes. Moderate volume of colonic stool. Vascular/Lymphatic: Mild atherosclerosis of the aortoiliac bifurcation. No aneurysm. No adenopathy. Reproductive: Uterus and bilateral adnexa are unremarkable. Other: No free air, free fluid, or intra-abdominal fluid collection. Tiny fat containing umbilical hernia. Musculoskeletal: There are no acute or suspicious osseous abnormalities. 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 IMPRESSION: 1. Minimal left hydroureteronephrosis and perinephric edema without obstructing stone. Findings may be due to recently passed stone or urinary tract infection. 2. Hepatomegaly and advanced hepatic steatosis. 3. Cholelithiasis without  gallbladder inflammation. Aortic Atherosclerosis (ICD10-I70.0). Electronically Signed   By: Keith Rake M.D.   On: 08/29/2022 18:10   DG Chest 2 View  Result Date: 08/29/2022 CLINICAL DATA:  Chest pain EXAM: CHEST - 2 VIEW COMPARISON:  Radiograph 12/16/2027 FINDINGS: Cardiomediastinal silhouette is within normal limits. There is no focal airspace consolidation. There is no pleural effusion. No pneumothorax. There is and no acute osseous abnormality. Thoracic spondylosis. IMPRESSION: No evidence of acute cardiopulmonary disease. Electronically Signed   By: Maurine Simmering M.D.   On: 08/29/2022 17:00    Microbiology: Results for orders placed or performed during the hospital encounter of 08/29/22  Culture, blood (Routine x 2)     Status: None (Preliminary result)   Collection Time: 08/29/22  4:21 PM   Specimen: BLOOD LEFT ARM  Result Value Ref Range Status   Specimen Description   Final    BLOOD LEFT ARM Performed at [facility], [address] [place], [place] [address]    Special Requests   Final    BOTTLES DRAWN AEROBIC AND ANAEROBIC Blood Culture adequate volume Performed at [facility], [address], [place], [place] [address]    Culture   Final    NO GROWTH 4 DAYS Performed at [place] [address], [place], [place] [address]    Report Status PENDING  Incomplete  Culture, blood (Routine x 2)     Status: None (Preliminary result)   Collection Time: 08/29/22  5:17 PM   Specimen: BLOOD  Result Value Ref Range Status   Specimen Description BLOOD SITE NOT SPECIFIED  Final   Special Requests   Final    BOTTLES DRAWN AEROBIC AND ANAEROBIC Blood Culture results may not be optimal due to an inadequate volume of blood received in culture bottles   Culture   Final    NO GROWTH 4 DAYS Performed at [facility], [place] [address] [place], [place] [address]    Report Status PENDING  Incomplete  Urine Culture     Status: None   Collection Time: 08/29/22 11:00 PM   Specimen: Urine, Clean Catch  Result Value Ref Range Status   Specimen Description   Final    URINE, CLEAN CATCH Performed at [facility], [place] [address] [place], [place] [address]    Special Requests   Final    NONE Performed at [facility], [place] [address] [place], [place] [address]    Culture   Final    NO GROWTH Performed at [facility], [place] [address] [place], [place] [address]    Report Status 08/31/2022 FINAL  Final  Urine Culture     Status: None   Collection Time: 08/31/22  1:02 PM   Specimen: Urine, Clean Catch  Result Value Ref Range Status   Specimen Description   Final    URINE, CLEAN CATCH Performed at [facility], [place] [address] [place], [place] [address]    Special Requests   Final    NONE Performed at [facility], [place] [address] [place], [place] [address]    Culture   Final    NO  GROWTH Performed at [facility], [place] [address], [place], [place] [address]    Report Status 09/01/2022 FINAL  Final    Labs: CBC: Recent Labs  Lab 08/29/22 1621 08/30/22 0429 08/31/22 0526 09/01/22 0528  WBC 12.2* 10.2 6.7 5.9  NEUTROABS 10.7* 7.4  --   --  HGB 12.5 10.8* 11.8* 11.4*  HCT 37.4 32.9* 37.7 35.5*  MCV 86.8 92.4 94.7 91.0  PLT 246 196 188 629   Basic Metabolic Panel: Recent Labs  Lab 08/29/22 1621 08/29/22 1638 08/29/22 2309 08/30/22 0429 08/31/22 0526 09/01/22 0528  NA 132*  --  136 135 138 137  K 3.1*  --  3.6 3.3* 3.5 3.5  CL 98  --  105 107 106 105  CO2 20*  --  '23 24 24 25  '$ GLUCOSE 150*  --  161* 111* 104* 99  BUN 9  --  '7 7 6 8  '$ CREATININE 0.69  --  0.62 0.66 0.70 0.71  CALCIUM 8.9  --  8.3* 7.9* 8.4* 8.6*  MG  --  1.5* 2.1 2.1  --   --   PHOS  --   --  2.9 2.9  --   --    Liver Function Tests: Recent Labs  Lab 08/29/22 1621 08/30/22 0429 09/01/22 0528  AST '17 17 29  '$ ALT 25 25 34  ALKPHOS 83 60 70  BILITOT 1.1 0.6 0.4  PROT 7.8 6.2* 7.0  ALBUMIN 4.2 3.0* 3.1*   CBG: Recent Labs  Lab 08/30/22 1428  GLUCAP 99    Discharge time spent:46 minutes.   Signed: Hosie Poisson, MD [facility] 09/02/2022

## 2022-09-02 NOTE — Telephone Encounter (Signed)
Transition Care Management Unsuccessful Follow-up Telephone Call  Date of discharge and from where:  09/01/22 from [facility]  Attempts:  2nd Attempt  Reason for unsuccessful TCM follow-up call:  I reached husband who said to call back in 20-30 mins once he is home so that I can speak with patient. He states she doing much better.

## 2022-09-02 NOTE — Telephone Encounter (Signed)
Transition Care Management Follow-up Telephone Call Date of discharge and from where: 09/01/22 from [facility] How have you been since you were released from the hospital? "Today is a good day for me" Any questions or concerns? No  Items Reviewed: Did the pt receive and understand the discharge instructions provided? Yes  Medications obtained and verified? Yes  Other? No  Any new allergies since your discharge? No  Dietary orders reviewed? Yes Do you have support at home? Yes   Home Care and Equipment/Supplies: Were home health services ordered? not applicable  Were any new equipment or medical supplies ordered?  No   Functional Questionnaire: (I = Independent and D = Dependent) ADLs: I  Bathing/Dressing- I  Meal Prep- I  Eating- I  Maintaining continence- I  Transferring/Ambulation- I  Managing Meds- I  Follow up appointments reviewed:  PCP Hospital f/u appt confirmed? Yes  Scheduled to see Dr Martinique on Sept 18 @ 9a. [facility] f/u appt confirmed? No   Are transportation arrangements needed? No  If their condition worsens, is the pt aware to call PCP or go to the Emergency Dept.? Yes Was the patient provided with contact information for the PCP's office or ED? Yes Was to pt encouraged to call back with questions or concerns? Yes

## 2022-09-02 NOTE — Telephone Encounter (Signed)
Pt's spouse called to say Pt was just discharged from the hospital and is still recuperating. Spouse stated that once Pt starts feeling better, they will call back to schedule a flu shot.  Thank you.

## 2022-09-03 LAB — CULTURE, BLOOD (ROUTINE X 2)
Culture: NO GROWTH
Culture: NO GROWTH
Special Requests: ADEQUATE

## 2022-09-06 ENCOUNTER — Encounter: Payer: Self-pay | Admitting: Obstetrics & Gynecology

## 2022-09-06 ENCOUNTER — Other Ambulatory Visit (HOSPITAL_COMMUNITY)
Admission: RE | Admit: 2022-09-06 | Discharge: 2022-09-06 | Disposition: A | Discharge status: Home / Self Care | Payer: 59 | Source: Ambulatory Visit | Attending: Obstetrics & Gynecology | Admitting: Obstetrics & Gynecology

## 2022-09-06 ENCOUNTER — Ambulatory Visit (INDEPENDENT_AMBULATORY_CARE_PROVIDER_SITE_OTHER): Payer: 59 | Admitting: Obstetrics & Gynecology

## 2022-09-06 VITALS — BP 124/72 | HR 77 | Ht 62.0 in | Wt 155.0 lb

## 2022-09-06 DIAGNOSIS — Z01419 Encounter for gynecological examination (general) (routine) without abnormal findings: Secondary | ICD-10-CM | POA: Diagnosis present

## 2022-09-06 DIAGNOSIS — Z9851 Tubal ligation status: Secondary | ICD-10-CM | POA: Diagnosis not present

## 2022-09-06 DIAGNOSIS — N951 Menopausal and female climacteric states: Secondary | ICD-10-CM
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
## 2022-09-08 LAB — HM MAMMOGRAPHY

## 2022-09-10 ENCOUNTER — Encounter: Payer: Self-pay | Admitting: Family Medicine

## 2022-09-10 LAB — CYTOLOGY - PAP: Diagnosis: NEGATIVE

## 2022-09-10 NOTE — Progress Notes (Unsigned)
HPI: Tracey Wood is a 56 y.o. female, who is here today for her routine physical and hospital follow up.  Last CPE: Over a year ago.  She has not been consistent with regular physical activity. In general she is following a healthful diet. Never smoker.  Chronic medical problems: Vit D def,GERD,HTN,HLD,hypoMg++, RA,and recurrent UTI among some.  Immunization History  Administered Date(s) Administered   Influenza,inj,Quad PF,6+ Mos 09/19/2019   Influenza-Unspecified 12/15/2010, 11/16/2016   PFIZER(Purple Top)SARS-COV-2 Vaccination 02/28/2020, 03/20/2020   PPD Test 10/22/2009   Tdap 12/27/2005   Health Maintenance  Topic Date Due   Hepatitis C Screening  Never done   Zoster Vaccines- Shingrix (1 of 2) Never done   COVID-19 Vaccine (3 - Pfizer risk series) 04/17/2020   INFLUENZA VACCINE  07/27/2022   TETANUS/TDAP  01/15/2027 (Originally 12/28/2015)   MAMMOGRAM  09/08/2024   PAP SMEAR-Modifier  09/06/2025   COLONOSCOPY (Pts 45-81yr Insurance coverage will need to be confirmed)  04/04/2030   HIV Screening  Completed   HPV VACCINES  Aged Out   Saw her gynecologist on 09/06/22.  HLD: She is not on pharmacologic treatment. Lab Results  Component Value Date   CHOL 132 08/30/2022   HDL 44 08/30/2022   LDLCALC 71 08/30/2022   LDLDIRECT 174.0 04/06/2021   TRIG 87 08/30/2022   CHOLHDL 3.0 08/30/2022   HTN on Amlodipine-Benazepril 5-20 mg daily. Negative for severe/frequent headache, chest pain, dyspnea, or claudication. She had an episode of CP during hospitalization. Chest CTA on 08/30/22 negative for PE.  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 Vit D deficiency: Her rheumatologist is prescribing Ergocalciferol 50,000 U,takes it 2 x week. She would like vit D to be checked today.  Since  her last visit she was hospitalized for pyelonephritis, from 9/3 to 09/01/22. Abdominal/Pelvic CT on 08/29/22 did show mild left-sided hydroureteronephrosis and perinephric edema.  No obstructing stones were noted.  No abscess. Completed treatment with cephalexin. Urinary symptoms have resolved. She has not seen her urologist in over a year.  She also started with left writ pain 7 days ago, severe. She is following with ortho. She has a f/u appt later this week. Right-handed.  Review of Systems  Constitutional:  Positive for fatigue. Negative for activity change, appetite change and fever.  HENT:  Negative for hearing loss, mouth sores, sore throat and trouble swallowing.   Eyes:  Negative for redness and visual disturbance.  Respiratory:  Negative for cough, shortness of breath and wheezing.   Cardiovascular:  Negative for chest pain, palpitations and leg swelling.  Gastrointestinal:  Negative for abdominal pain, nausea and vomiting.       No changes in bowel habits.  Endocrine: Negative for cold intolerance, heat intolerance, polydipsia, polyphagia and polyuria.  Genitourinary:  Negative for decreased urine volume, dysuria and hematuria.  Musculoskeletal:  Positive for arthralgias. Negative for gait problem.  Skin:  Negative for color change and rash.  Allergic/Immunologic: Positive for environmental allergies.  Neurological:  Negative for syncope, weakness and headaches.  Hematological:  Negative for adenopathy. Does not bruise/bleed easily.  Psychiatric/Behavioral:  Negative for confusion. The patient is not nervous/anxious.   All other systems reviewed and are negative.  Current Outpatient Medications on File Prior to Visit  Medication Sig Dispense Refill  acetaminophen (TYLENOL) 500 MG tablet Take 1,000 mg by mouth daily as needed for moderate pain.     amLODipine-benazepril (LOTREL) 5-20 MG capsule TAKE 1 CAPSULE BY MOUTH EVERY DAY 90 capsule 2   ORENCIA CLICKJECT 599 MG/ML SOAJ  Inject 1 Syringe into the skin once a week. Sunday     No current facility-administered medications on file prior to visit.   Past Medical History:  Diagnosis Date   Bronchitis    Cataract    had sx    Hypertension    Rheumatoid arthritis ([place])    Thyroid disease    UTI (urinary tract infection)    Past Surgical History:  Procedure Laterality Date   CATARACT EXTRACTION     R & L   TUBAL LIGATION     Allergies  Allergen Reactions   Morphine And Related Other (See Comments)    Fast heart beat   Ciprofloxacin Rash   Oxycodone Palpitations   Tape Dermatitis   Family History  Problem Relation Age of Onset   Breast cancer Maternal Grandmother 77   Colon cancer Neg Hx    Colon polyps Neg Hx    Esophageal cancer Neg Hx    Rectal cancer Neg Hx    Stomach cancer Neg Hx    Social History   Socioeconomic History   Marital status: Married    Spouse name: Not on file   Number of children: Not on file   Years of education: Not on file   Highest education level: Not on file  Occupational History   Not on file  Tobacco Use   Smoking status: Never   Smokeless tobacco: Never  Vaping Use   Vaping Use: Never used  Substance and Sexual Activity   Alcohol use: No    Alcohol/week: 0.0 standard drinks of alcohol   Drug use: No   Sexual activity: Yes    Partners: Male    Birth control/protection: Other-see comments    Comment: 1st intercourse- 82, partners- 2, married- 41 yrs   Other Topics Concern   Not on file  Social History Narrative   Right handed   One story   No caffeine x 2 weeks   Social Determinants of Health   Financial Resource Strain: Not on file  Food Insecurity: No Food Insecurity (08/31/2022)   Hunger Vital Sign    Worried About Running Out of Food in the Last Year: Never true    Ran Out of Food in the Last Year: Never true  Transportation Needs: No Transportation Needs (08/31/2022)   PRAPARE - Hydrologist (Medical): No     Lack of Transportation (Non-Medical): No  Physical Activity: Not on file  Stress: Not on file  Social Connections: Not on file   Vitals:   09/13/22 0706  BP: 128/80  Pulse: 71  Resp: 12  SpO2: 98%   Body mass index is 28.26 kg/m. Wt Readings from Last 3 Encounters:  09/13/22 154 lb 8 oz (70.1 kg)  09/06/22 155 lb (70.3 kg)  08/29/22 160 lb (72.6 kg)  Physical Exam Vitals and nursing note reviewed.  Constitutional:      General: She is not in acute distress.    Appearance: She is well-developed and well-groomed.  HENT:     Head: Normocephalic and atraumatic.     Right Ear: Hearing, tympanic membrane, ear canal and external ear normal.     Left Ear: Hearing, tympanic membrane, ear canal and external ear normal.  Mouth/Throat:     Mouth: Mucous membranes are moist.     Pharynx: Oropharynx is clear. Uvula midline.  Eyes:     Extraocular Movements: Extraocular movements intact.     Conjunctiva/sclera: Conjunctivae normal.     Pupils: Pupils are equal, round, and reactive to light.  Neck:     Thyroid: No thyromegaly.     Trachea: No tracheal deviation.  Cardiovascular:     Rate and Rhythm: Normal rate and regular rhythm.     Pulses:          Dorsalis pedis pulses are 2+ on the right side and 2+ on the left side.     Heart sounds: No murmur heard. Pulmonary:     Effort: Pulmonary effort is normal. No respiratory distress.     Breath sounds: Normal breath sounds.  Abdominal:     Palpations: Abdomen is soft. There is no hepatomegaly or mass.     Tenderness: There is no abdominal tenderness.  Genitourinary:    Comments: Deferred to gyn. Musculoskeletal:     Comments: No signs of synovitis appreciated. Left wrist splint.  Lymphadenopathy:     Cervical: No cervical adenopathy.     Upper Body:     Right upper body: No supraclavicular adenopathy.     Left upper body: No supraclavicular adenopathy.  Skin:    General: Skin is warm.     Findings: No erythema or rash.   Neurological:     General: No focal deficit present.     Mental Status: She is alert and oriented to person, place, and time.     Cranial Nerves: No cranial nerve deficit.     Coordination: Coordination normal.     Gait: Gait normal.     Deep Tendon Reflexes:     Reflex Scores:      Bicep reflexes are 2+ on the right side and 2+ on the left side.      Patellar reflexes are 2+ on the right side and 2+ on the left side. Psychiatric:        Mood and Affect: Mood and affect normal.   ASSESSMENT AND PLAN:  Ms. Kemberly Taves Akter was here today annual physical examination and follow up.  Orders Placed This Encounter  Procedures   US Renal   Comprehensive metabolic panel   Magnesium   Hemoglobin A1c   VITAMIN D 25 Hydroxy (Vit-D Deficiency, Fractures)   Lab Results  Component Value Date   HGBA1C 6.3 09/13/2022   Lab Results  Component Value Date   CREATININE 0.77 09/13/2022   BUN 17 09/13/2022   NA 135 09/13/2022   K 4.1 09/13/2022   CL 102 09/13/2022   CO2 23 09/13/2022   Lab Results  Component Value Date   ALT 28 09/13/2022   AST 16 09/13/2022   ALKPHOS 94 09/13/2022   BILITOT 0.5 09/13/2022   Routine general medical examination at a health care facility We discussed the importance of regular physical activity and healthy diet for prevention of chronic illness and/or complications. Preventive guidelines reviewed. Vaccination:Declines vaccination.  Ca++ and vit D supplementation recommended. Continue following with gyn for her routine female care. Next CPE in a year. The 10-year ASCVD risk score (Arnett DK, et al., 2019) is: 1.9%   Values used to calculate the score:     Age: 38 years     Sex: Female     Is Non-Hispanic African American: No     Diabetic: No  Tobacco smoker: No     Systolic Blood Pressure: 038 mmHg     Is BP treated: Yes     HDL Cholesterol: 44 mg/dL     Total Cholesterol: 132 mg/dL  Hyperlipidemia, mixed Improved. Continue non  pharmacologic treatment. FLP can be repeated annually.  Vitamin D deficiency, unspecified Continue current dose of vit D supplementation. Followed by her rheumatologist.  Atherosclerosis of aorta ([place]) Seen on abdominal CT. She does not want to take statin medications. Benefits from aspirin 81 mg daily.  Screening for endocrine, metabolic and immunity disorder -     Hemoglobin A1c -     Comprehensive metabolic panel  Hypomagnesemia Continue current Mg supplementation. Further recommendations according to Mg result.  Hydroureteronephrosis S/P acute pyelonephritis. UCx no growth, Blood Cx no growth after 5 days.. Symptoms have resolved. Renal US will be arranged. Instructed about warning signs.  Return in 1 year (on 09/14/2023).  Ryen Heitmeyer G. [place], MD  [facility]. [place] office.

## 2022-09-13 ENCOUNTER — Encounter: Payer: Self-pay | Admitting: Family Medicine

## 2022-09-13 ENCOUNTER — Ambulatory Visit (INDEPENDENT_AMBULATORY_CARE_PROVIDER_SITE_OTHER): Payer: 59 | Admitting: Family Medicine

## 2022-09-13 VITALS — BP 128/80 | HR 71 | Resp 12 | Ht 62.0 in | Wt 154.5 lb

## 2022-09-13 DIAGNOSIS — E559 Vitamin D deficiency, unspecified: Secondary | ICD-10-CM | POA: Diagnosis not present

## 2022-09-13 DIAGNOSIS — N10 Acute tubulo-interstitial nephritis: Secondary | ICD-10-CM

## 2022-09-13 DIAGNOSIS — I700 Atherosclerosis of aorta: Secondary | ICD-10-CM | POA: Diagnosis not present

## 2022-09-13 DIAGNOSIS — Z1329 Encounter for screening for other suspected endocrine disorder: Secondary | ICD-10-CM

## 2022-09-13 DIAGNOSIS — Z130 Encounter for screening for diseases of the blood and blood-forming organs and certain disorders involving the immune mechanism: Secondary | ICD-10-CM | POA: Diagnosis not present

## 2022-09-13 DIAGNOSIS — Z0000 Encounter for general adult medical examination without abnormal findings: Secondary | ICD-10-CM

## 2022-09-13 DIAGNOSIS — Z13228 Encounter for screening for other metabolic disorders: Secondary | ICD-10-CM

## 2022-09-13 DIAGNOSIS — E782 Mixed hyperlipidemia: Secondary | ICD-10-CM

## 2022-09-13 DIAGNOSIS — N1330 Unspecified hydronephrosis: Secondary | ICD-10-CM

## 2022-09-13 LAB — COMPREHENSIVE METABOLIC PANEL
ALT: 28 U/L (ref 0–35)
AST: 16 U/L (ref 0–37)
Albumin: 4 g/dL (ref 3.5–5.2)
Alkaline Phosphatase: 94 U/L (ref 39–117)
BUN: 17 mg/dL (ref 6–23)
CO2: 23 mEq/L (ref 19–32)
Calcium: 9.3 mg/dL (ref 8.4–10.5)
Chloride: 102 mEq/L (ref 96–112)
Creatinine, Ser: 0.77 mg/dL (ref 0.40–1.20)
GFR: 87.71 mL/min (ref >60.00)
Glucose, Bld: 88 mg/dL (ref 70–99)
Potassium: 4.1 mEq/L (ref 3.5–5.1)
Sodium: 135 mEq/L (ref 135–145)
Total Bilirubin: 0.5 mg/dL (ref 0.2–1.2)
Total Protein: 7.8 g/dL (ref 6.0–8.3)

## 2022-09-13 LAB — MAGNESIUM: Magnesium: 2.1 mg/dL (ref 1.5–2.5)

## 2022-09-13 LAB — HEMOGLOBIN A1C: Hgb A1c MFr Bld: 6.3 % (ref 4.6–6.5)

## 2022-09-13 LAB — VITAMIN D 25 HYDROXY (VIT D DEFICIENCY, FRACTURES): VITD: 27.34 ng/mL — ABNORMAL LOW (ref 30.00–100.00)

## 2022-09-13 NOTE — Patient Instructions (Addendum)
A few things to remember from today's visit:   Routine general medical examination at a health care facility  Hyperlipidemia, mixed  Vitamin D deficiency, unspecified - Plan: VITAMIN D 25 Hydroxy (Vit-D Deficiency, Fractures)  Atherosclerosis of aorta (HCC)  Acute pyelonephritis - Plan: US Renal  Screening for endocrine, metabolic and immunity disorder - Plan: Comprehensive metabolic panel, Hemoglobin A1c  Hypomagnesemia - Plan: Magnesium  If you need refills for medications you take chronically, please call your pharmacy. Do not use My Chart to request refills or for acute issues that need immediate attention. If you send a my chart message, it may take a few days to be addressed, specially if I am not in the office.  Please be sure medication list is accurate. If a new problem present, please set up appointment sooner than planned today.        Mantenimiento de Technical sales engineer en [place] [employer], Female Adoptar un estilo de vida saludable y recibir atencin preventiva son importantes para promover la salud y Musician. Consulte al mdico sobre: El esquema adecuado para hacerse pruebas y exmenes peridicos. Cosas que puede hacer por su cuenta para prevenir enfermedades y [place] sano. Qu debo saber sobre la dieta, el peso y el ejercicio? Consuma una dieta saludable  Consuma una dieta que incluya muchas verduras, frutas, productos lcteos con bajo contenido de [place] y Advertising account planner. No consuma muchos alimentos ricos en grasas slidas, azcares agregados o sodio. Mantenga un peso saludable El ndice de masa muscular [facility]) se [place] the [place] para identificar problemas de [place]. Proporciona una estimacin de la grasa corporal basndose en el peso y la altura. Su mdico puede ayudarle a Radiation protection practitioner [place] y a Scientist, forensic o Theatre manager un peso saludable. Haga ejercicio con regularidad Haga ejercicio con regularidad. Esta es una de las prcticas ms importantes que puede hacer  por su salud. La [employer] de los adultos deben seguir estas pautas: Optometrist, al menos, 150 minutos de actividad fsica por semana. El ejercicio debe aumentar la frecuencia cardaca y Nature conservation officer transpirar (ejercicio de intensidad moderada). Hacer ejercicios de fortalecimiento por lo [employer] por semana. Agregue esto a su plan de ejercicio de intensidad moderada. Pase menos tiempo sentada. Incluso la actividad fsica ligera puede ser beneficiosa. Controle sus niveles de colesterol y lpidos en la sangre Comience a realizarse anlisis de lpidos y Research officer, trade union en la sangre a los 106 aos y luego reptalos cada 5 aos. Hgase controlar los niveles de colesterol con mayor frecuencia si: Sus niveles de lpidos y colesterol son altos. Es mayor de 26 aos. Presenta un alto riesgo de padecer enfermedades cardacas. Qu debo saber sobre las pruebas de deteccin del cncer? Segn su historia clnica y sus antecedentes familiares, es posible que deba realizarse pruebas de deteccin del cncer en diferentes edades. Esto puede incluir pruebas de deteccin de lo siguiente: Cncer de mama. Cncer de cuello uterino. Cncer colorrectal. Cncer de piel. Cncer de pulmn. Qu debo saber sobre la enfermedad cardaca, la diabetes y la hipertensin arterial? Presin arterial y enfermedad cardaca La hipertensin arterial causa enfermedades cardacas y [place] el riesgo de accidente cerebrovascular. Es ms probable que esto se manifieste en las personas que tienen lecturas de presin arterial alta o tienen sobrepeso. Hgase controlar la presin arterial: Cada 3 a 5 aos si tiene entre 18 y 27 aos. Todos los aos si es mayor de 40 aos. Diabetes Realcese exmenes de deteccin de la diabetes con regularidad. Este anlisis revisa el nivel de azcar en la  sangre en ayunas. Hgase las pruebas de deteccin: Cada tres aos despus de los 86 aos de edad si tiene un peso normal y un bajo riesgo de padecer  diabetes. Con ms frecuencia y a partir de [place] edad inferior si tiene sobrepeso o un alto riesgo de padecer diabetes. Qu debo saber sobre la prevencin de infecciones? Hepatitis B Si tiene un riesgo ms alto de contraer hepatitis B, debe someterse a un examen de deteccin de este virus. Hable con el mdico para averiguar si tiene riesgo de contraer la infeccin por hepatitis B. Hepatitis C Se recomienda el anlisis a: [employer] 1945 y 1965. Todas las personas que tengan un riesgo de haber contrado hepatitis C. Enfermedades de transmisin sexual (ETS) Hgase las pruebas de Programme researcher, broadcasting/film/video de ITS, incluidas la gonorrea y la clamidia, si: Es sexualmente activa y es menor de 37 aos. Es mayor de 62 aos, y Investment banker, operational informa que corre riesgo de tener este tipo de infecciones. La actividad sexual ha cambiado desde que le hicieron la ltima prueba de deteccin y tiene un riesgo mayor de Best boy clamidia o Radio broadcast assistant. Pregntele al mdico si usted tiene riesgo. Pregntele al mdico si usted tiene un alto riesgo de Museum/gallery curator VIH. El mdico tambin puede recomendarle un medicamento recetado para ayudar a evitar la infeccin por el VIH. Si elige tomar medicamentos para prevenir el VIH, primero debe [employer] de deteccin del VIH. Luego debe hacerse anlisis cada 3 meses mientras est tomando los medicamentos. Embarazo Si est por dejar de Librarian, academic (fase premenopusica) y usted puede quedar [place], busque asesoramiento antes de [place]. Tome de 400 a 800 microgramos (mcg) de cido [employer] si [place]. Pida mtodos de control de la natalidad (anticonceptivos) si desea evitar un embarazo no deseado. Osteoporosis y [place] La osteoporosis es una enfermedad en la que los huesos pierden los minerales y la fuerza por el avance de la edad. El resultado pueden ser fracturas en los [place]. Si tiene 21 aos o ms, o si est en riesgo de sufrir  osteoporosis y fracturas, pregunte a su mdico si debe: Hacerse pruebas de deteccin de prdida sea. Tomar un suplemento de calcio o de vitamina D para reducir el riesgo de fracturas. Recibir terapia de reemplazo hormonal (TRH) para tratar los sntomas de la menopausia. Siga estas indicaciones en su casa: Consumo de alcohol No beba alcohol si: Su mdico le indica no hacerlo. Est embarazada, puede estar embarazada o est tratando de [place]. Si bebe alcohol: Limite la cantidad que bebe a lo siguiente: De 0 a 1 bebida por da. Sepa cunta cantidad de alcohol hay en las bebidas que toma. En los [place], una medida equivale a una botella de cerveza de 12 oz (355 ml), un vaso de vino de 5 oz (148 ml) o un vaso de una bebida alcohlica de alta graduacin de 1 oz (44 ml). Estilo de vida No consuma ningn producto que contenga nicotina o tabaco. Estos productos incluyen cigarrillos, tabaco para Higher education careers adviser y aparatos de vapeo, como los Psychologist, sport and exercise. Si necesita ayuda para dejar de consumir estos productos, consulte al mdico. No consuma drogas. No comparta agujas. Solicite ayuda a su mdico si necesita apoyo o informacin para abandonar las drogas. Indicaciones generales Realcese los estudios de rutina de la salud, dentales y de Public librarian. [place]. Infrmele a su mdico si: Se siente deprimida con frecuencia. Alguna vez ha sido vctima de [place] o  no se siente seguro en su casa. Resumen Adoptar un estilo de vida saludable y recibir atencin preventiva son importantes para promover la salud y Musician. Siga las instrucciones del mdico acerca de una dieta saludable, el ejercicio y la realizacin de pruebas o exmenes para Engineer, building services. Siga las instrucciones del mdico con respecto al control del colesterol y la presin arterial. Esta informacin no tiene Marine scientist el consejo del mdico. Asegrese de hacerle al mdico  cualquier pregunta que tenga. Document Revised: 05/21/2021 Document Reviewed: 05/21/2021 Elsevier Patient Education  [place].

## 2022-09-15 ENCOUNTER — Telehealth: Payer: Self-pay | Admitting: Family Medicine

## 2022-09-15 ENCOUNTER — Other Ambulatory Visit (INDEPENDENT_AMBULATORY_CARE_PROVIDER_SITE_OTHER): Payer: 59

## 2022-09-15 ENCOUNTER — Encounter: Payer: Self-pay | Admitting: Family Medicine

## 2022-09-15 DIAGNOSIS — R300 Dysuria: Secondary | ICD-10-CM | POA: Diagnosis not present

## 2022-09-15 NOTE — Telephone Encounter (Signed)
UA and Ucx. Thanks, BJ

## 2022-09-15 NOTE — Telephone Encounter (Signed)
Mission completed - She did her labs a little while ago - Thank you

## 2022-09-15 NOTE — Telephone Encounter (Signed)
Pt is asking for an order to do a urine exam. Pt is having pain and burning when urinating since Monday.  Please call Pt as soon as order is in so that we can schedule her.   Respectfully, IC

## 2022-09-16 LAB — URINALYSIS, ROUTINE W REFLEX MICROSCOPIC
Bilirubin Urine: NEGATIVE
Ketones, ur: NEGATIVE
Leukocytes,Ua: NEGATIVE
Nitrite: NEGATIVE
Specific Gravity, Urine: 1.025 (ref 1.000–1.030)
Total Protein, Urine: NEGATIVE
Urine Glucose: NEGATIVE
Urobilinogen, UA: 0.2 (ref 0.0–1.0)
pH: 5.5 (ref 5.0–8.0)

## 2022-09-17 ENCOUNTER — Ambulatory Visit
Admission: RE | Admit: 2022-09-17 | Discharge: 2022-09-17 | Disposition: A | Discharge status: Home / Self Care | Payer: 59 | Source: Ambulatory Visit | Attending: Family Medicine | Admitting: Family Medicine

## 2022-09-17 DIAGNOSIS — N1330 Unspecified hydronephrosis: Secondary | ICD-10-CM

## 2022-09-18 LAB — URINE CULTURE
MICRO NUMBER:: 13948855
SPECIMEN QUALITY:: ADEQUATE

## 2022-09-20 ENCOUNTER — Other Ambulatory Visit: Payer: Self-pay

## 2022-09-20 ENCOUNTER — Telehealth: Payer: Self-pay | Admitting: Family Medicine

## 2022-09-20 MED ORDER — SULFAMETHOXAZOLE-TRIMETHOPRIM 800-160 MG PO TABS: 1.0000 | ORAL_TABLET | Freq: Two times a day (BID) | ORAL | 0 refills | Status: AC

## 2022-09-20 NOTE — Telephone Encounter (Signed)
See result note.  

## 2022-09-20 NOTE — Telephone Encounter (Signed)
Pt called to ask for the results of her Urinalysis. Pt is requesting a call back as soon as possible. Pt states she has a lot of pain in her lower back and thinks it may be her kidneys.  Also, Pt would like MD to send something for pain or to treat a possible kidney infection to:  [facility] [place] Phone:  [phone]  Fax:  [phone]      Please advise.

## 2022-10-11 NOTE — Progress Notes (Signed)
ACUTE VISIT Chief Complaint  Patient presents with   Pelvic Pain   HPI: [name] is a 56 y.o. female with history of hypertension, hyperlipidemia, and RA here today complaining of pelvic pain that started yesterday morning and she thinks it is cystitis. She reports experiencing intense pain after urination, increased frequency of urination, and no associated dysuria or gross hematuria. Hx of gross hematuria, she has had negative urology work-up.  Recurring UTIs. Hospitalized on 08/29/2022 with pyelonephritis, discharged on cephalexin. Renal CT on 08/29/2022: 1. Minimal left hydroureteronephrosis and perinephric edema without obstructing stone. Findings may be due to recently passed stone or urinary tract infection. Renal US 09/17/22:Normal study.  No hydronephrosis.  Called re[porting left flank/back pain, Ucx abnormal. Component 3 wk ago  MICRO NUMBER: [id number]   SPECIMEN QUALITY: Adequate   Sample Source NOT GIVEN   STATUS: FINAL   ISOLATE 1: Escherichia coli Abnormal    Comment: Greater than 100,000 CFU/mL of Escherichia coli  Treated with Bactrim DS twice daily x5 days.  Escherichia coli    URINE CULTURE, REFLEX    AMOX/CLAVULANIC <=2  Sensitive    AMPICILLIN 4  Sensitive    AMPICILLIN/SULBACTAM <=2  Sensitive    CEFAZOLIN <=4  Not Reportable 1    CEFEPIME <=1  Sensitive    CEFTAZIDIME <=1  Sensitive    CEFTRIAXONE <=1  Sensitive    CIPROFLOXACIN <=0.25  Sensitive    GENTAMICIN <=1  Sensitive    IMIPENEM <=0.25  Sensitive    LEVOFLOXACIN <=0.12  Sensitive    NITROFURANTOIN <=16  Sensitive    PIP/TAZO <=4  Sensitive    TOBRAMYCIN <=1  Sensitive    TRIMETH/SULFA <=20  Sensitive 2          DJD of the lumbar spine, she has seen Dr. Nelva Bush, visit on 09/22/2022.  She also mention a history of heavy vaginal bleeding in the past few months, with light spotting in between.  She had a gynecological appointment in September 10/2022 and normal examination.  She  reports having 2 endometrial biopsies, negative. Renal CT in 08/2022 showed ureteral son bilateral adnexa unremarkable.  Pelvic Pain The patient's primary symptoms include pelvic pain and vaginal bleeding. The patient's pertinent negatives include no genital itching, genital lesions, genital odor or genital rash. This is a recurrent problem. The current episode started yesterday. The pain is moderate. She is not pregnant. Associated symptoms include abdominal pain, back pain, flank pain and frequency. Pertinent negatives include no chills, constipation, discolored urine, dysuria, fever, headaches, hematuria, joint swelling, nausea, painful intercourse, rash, sore throat, urgency or vomiting.  Bilateral lower back pain with her LMP similar to labor pain.   She is also inquiring about results of hemoglobin A1c and hyperlipidemia. Hyperlipidemia: Currently on nonpharmacologic treatment.  Lab Results  Component Value Date   CHOL 132 08/30/2022   HDL 44 08/30/2022   LDLCALC 71 08/30/2022   LDLDIRECT 174.0 04/06/2021   TRIG 87 08/30/2022   CHOLHDL 3.0 08/30/2022   Prediabetes: She has not noted polydipsia, polyuria, polyphagia. Lab Results  Component Value Date   HGBA1C 6.3 09/13/2022   Review of Systems  Constitutional:  Negative for chills and fever.  HENT:  Negative for mouth sores and sore throat.   Respiratory:  Negative for cough, shortness of breath and wheezing.   Cardiovascular:  Negative for chest pain and leg swelling.  Gastrointestinal:  Positive for abdominal pain. Negative for constipation, nausea and vomiting.  Genitourinary:  Positive for flank pain,  frequency and pelvic pain. Negative for dysuria, hematuria and urgency.  Musculoskeletal:  Positive for back pain. Negative for gait problem.  Skin:  Negative for rash.  Neurological:  Negative for syncope, weakness and headaches.  Rest see pertinent positives and negatives per HPI.  Current Outpatient Medications on File  Prior to Visit  Medication Sig Dispense Refill   acetaminophen (TYLENOL) 500 MG tablet Take 1,000 mg by mouth daily as needed for moderate pain.     amLODipine-benazepril (LOTREL) 5-20 MG capsule TAKE 1 CAPSULE BY MOUTH EVERY DAY 90 capsule 2   ORENCIA CLICKJECT 607 MG/ML SOAJ Inject 1 Syringe into the skin once a week. Sunday     No current facility-administered medications on file prior to visit.   Past Medical History:  Diagnosis Date   Bronchitis    Cataract    had sx    Hypertension    Rheumatoid arthritis ([place])    Thyroid disease    UTI (urinary tract infection)    Allergies  Allergen Reactions   Morphine And Related Other (See Comments)    Fast heart beat   Ciprofloxacin Rash   Oxycodone Palpitations   Tape Dermatitis   Social History   Socioeconomic History   Marital status: Married    Spouse name: Not on file   Number of children: Not on file   Years of education: Not on file   Highest education level: Not on file  Occupational History   Not on file  Tobacco Use   Smoking status: Never   Smokeless tobacco: Never  Vaping Use   Vaping Use: Never used  Substance and Sexual Activity   Alcohol use: No    Alcohol/week: 0.0 standard drinks of alcohol   Drug use: No   Sexual activity: Yes    Partners: Male    Birth control/protection: Other-see comments    Comment: 1st intercourse- 25, partners- 2, married- 108 yrs   Other Topics Concern   Not on file  Social History Narrative   Right handed   One story   No caffeine x 2 weeks   Social Determinants of Health   Financial Resource Strain: Not on file  Food Insecurity: No Food Insecurity (08/31/2022)   Hunger Vital Sign    Worried About Running Out of Food in the Last Year: Never true    Ran Out of Food in the Last Year: Never true  Transportation Needs: No Transportation Needs (08/31/2022)   PRAPARE - Hydrologist (Medical): No    Lack of Transportation (Non-Medical): No   Physical Activity: Not on file  Stress: Not on file  Social Connections: Not on file   Vitals:   10/12/22 0805  BP: 120/70  Pulse: 98  Resp: 12  Temp: 98 F (36.7 C)  SpO2: 99%  Body mass index is 28.21 kg/m.  Physical Exam Vitals and nursing note reviewed.  Constitutional:      General: She is not in acute distress.    Appearance: She is well-developed. She is not ill-appearing.  HENT:     Head: Normocephalic and atraumatic.     Mouth/Throat:     Mouth: Mucous membranes are moist.     Pharynx: Oropharynx is clear.  Eyes:     General: No scleral icterus.    Conjunctiva/sclera: Conjunctivae normal.  Cardiovascular:     Rate and Rhythm: Normal rate and regular rhythm.     Heart sounds: No murmur heard. Pulmonary:  Effort: Pulmonary effort is normal. No respiratory distress.     Breath sounds: Normal breath sounds.  Abdominal:     General: Bowel sounds are normal. There is no distension.     Palpations: Abdomen is soft. There is no hepatomegaly or mass.     Tenderness: There is no abdominal tenderness. There is left CVA tenderness (Elicits mild pain). There is no right CVA tenderness.  Lymphadenopathy:     Cervical: No cervical adenopathy.     Upper Body:     Right upper body: No supraclavicular adenopathy.     Left upper body: No supraclavicular adenopathy.  Skin:    General: Skin is warm.     Findings: No erythema or rash.  Neurological:     Mental Status: She is alert and oriented to person, place, and time.  Psychiatric:     Comments: Well groomed, good eye contact.   ASSESSMENT AND PLAN:  [name] was seen today for pelvic pain.  Diagnoses and all orders for this visit: Orders Placed This Encounter  Procedures   Culture, Urine   Suprapubic abdominal pain We discussed possible etiologies, including urologic, GI, and gynecology. She is reporting last gynecology exam as negative. I do not think imaging or blood work are needed at this  time. Instructed about warning signs.  Recurrent UTI In the past month she has been treated with antibiotics x3, including today. Recent gynecologic evaluation reported as negative. Renal US in 08/2022 negative for hydronephrosis. If problem is persistent, we will need to consider prophylactic antibiotic treatment and/or follow-up with urologist. Continue adequate hydration. OTC cranberry pills or d-mannose may help with prevention.  Prediabetes We discussed risk factors for diabetes. Encouraged consistency with a healthy lifestyle for diabetes prevention. We can continue checking A1c annually.  Hyperlipidemia, mixed Last lipid panel done in 08/2022, in normal range. Continue nonpharmacologic treatment. We can recheck lipid panel next year during her physical.  DUB (dysfunctional uterine bleeding) Reporting endometrial biopsy x2 negative. Continue following with gynecologist. She may need to consider hormonal therapy.  I spent a total of 40 minutes in both face to face and non face to face activities for this visit on the date of this encounter. During this time history was obtained and documented, examination was performed, prior labs/imaging reviewed, and assessment/plan discussed.  Return if symptoms worsen or fail to improve.  Tracey Wood G. Martinique, MD  [facility]. [place] office.

## 2022-10-12 ENCOUNTER — Encounter: Payer: Self-pay | Admitting: Family Medicine

## 2022-10-12 ENCOUNTER — Ambulatory Visit (INDEPENDENT_AMBULATORY_CARE_PROVIDER_SITE_OTHER): Payer: 59 | Admitting: Family Medicine

## 2022-10-12 VITALS — BP 120/70 | HR 98 | Temp 98.0°F | Resp 12 | Ht 62.0 in | Wt 154.2 lb

## 2022-10-12 DIAGNOSIS — N390 Urinary tract infection, site not specified: Secondary | ICD-10-CM

## 2022-10-12 DIAGNOSIS — R7303 Prediabetes: Secondary | ICD-10-CM

## 2022-10-12 DIAGNOSIS — N938 Other specified abnormal uterine and vaginal bleeding: Secondary | ICD-10-CM

## 2022-10-12 DIAGNOSIS — R102 Pelvic and perineal pain: Secondary | ICD-10-CM

## 2022-10-12 DIAGNOSIS — E782 Mixed hyperlipidemia: Secondary | ICD-10-CM

## 2022-10-12 MED ORDER — NITROFURANTOIN MONOHYD MACRO 100 MG PO CAPS: 100.0000 mg | ORAL_CAPSULE | Freq: Two times a day (BID) | ORAL | 0 refills | Status: AC

## 2022-10-12 NOTE — Patient Instructions (Addendum)
A few things to remember from today's visit:  Suprapubic abdominal pain  Recurrent UTI - Plan: nitrofurantoin, macrocrystal-monohydrate, (MACROBID) 100 MG capsule, Culture, Urine  Prediabetes Antes de tratar de lavar los odos pngase gotas por una semana. No cosas en el odo. Hoy estamos empezando antibiticos para una posible cistitis. Vamos a seguir el cultivo y si es necesario cambiamos el antibitico. Hospital doctor.  If you need refills for medications you take chronically, please call your pharmacy. Do not use My Chart to request refills or for acute issues that need immediate attention. If you send a my chart message, it may take a few days to be addressed, specially if I am not in the office.  Please be sure medication list is accurate. If a new problem present, please set up appointment sooner than planned today.

## 2022-10-12 NOTE — Assessment & Plan Note (Addendum)
In the past month she has been treated with antibiotics x3, including today. Recent gynecologic evaluation reported as negative. Renal US in 08/2022 negative for hydronephrosis. If problem is persistent, we will need to consider prophylactic antibiotic treatment and/or follow-up with urologist. Continue adequate hydration. OTC cranberry pills or d-mannose may help with prevention.

## 2022-10-12 NOTE — Assessment & Plan Note (Signed)
Last lipid panel done in 08/2022, in normal range. Continue nonpharmacologic treatment. We can recheck lipid panel next year during her physical.

## 2022-10-12 NOTE — Assessment & Plan Note (Signed)
We discussed risk factors for diabetes. Encouraged consistency with a healthy lifestyle for diabetes prevention. We can continue checking A1c annually.

## 2022-10-15 LAB — URINE CULTURE
MICRO NUMBER:: 14061613
SPECIMEN QUALITY:: ADEQUATE

## 2023-01-13 LAB — HM MAMMOGRAPHY

## 2023-01-17 ENCOUNTER — Encounter: Payer: Self-pay | Admitting: Family Medicine

## 2023-01-25 NOTE — Progress Notes (Signed)
ACUTE VISIT Chief Complaint  Patient presents with   Back Pain   HPI: [name] is a 57 y.o. female with PMHx significant HTN, aortic atherosclerosis,GERD,recurrent UTI, and RA here today with her husband complaining of 2 months of constant right upper back pain, interscapular. Pain is dull/pressure, 5/10, sometimes 8/10. Not radiated.  No hx of trauma or unusual activity.  Back Pain This is a new problem. The current episode started 1 to 4 weeks ago. The problem occurs constantly. The problem is unchanged. The pain is present in the thoracic spine. The pain does not radiate. The pain is moderate. Pertinent negatives include no abdominal pain, fever, headaches, numbness, weakness or weight loss. Risk factors include sedentary lifestyle. She has tried nothing for the symptoms.  She has not identified exacerbating or alleviating factors.  RA, arthralgias have greatly improved, she is not longer on Orencia. She follows with rheumatologist regularly.  She also has persistent cough since 11/2022. No associated fever,chills,CP,SOB, cough, or rash.  Aortic atherosclerosis seen on imaging in 08/2022. Abdominal CT on 08/29/22:Mild aortic atherosclerosis. Lab Results  Component Value Date   CHOL 132 08/30/2022   HDL 44 08/30/2022   LDLCALC 71 08/30/2022   LDLDIRECT 174.0 04/06/2021   TRIG 87 08/30/2022   CHOLHDL 3.0 08/30/2022   Review of Systems  Constitutional:  Negative for chills, fever and weight loss.  HENT:  Negative for congestion, mouth sores and rhinorrhea.   Cardiovascular:  Negative for palpitations.  Gastrointestinal:  Negative for abdominal pain, nausea and vomiting.  Endocrine: Negative for cold intolerance and heat intolerance.  Genitourinary:  Negative for decreased urine volume and hematuria.  Musculoskeletal:  Positive for back pain.  Skin:  Negative for rash.  Neurological:  Negative for weakness, numbness and headaches.  Hematological:  Negative  for adenopathy. Does not bruise/bleed easily.  See other pertinent positives and negatives in HPI.  Current Outpatient Medications on File Prior to Visit  Medication Sig Dispense Refill   acetaminophen (TYLENOL) 500 MG tablet Take 1,000 mg by mouth daily as needed for moderate pain.     amLODipine-benazepril (LOTREL) 5-20 MG capsule TAKE 1 CAPSULE BY MOUTH EVERY DAY 90 capsule 2   No current facility-administered medications on file prior to visit.   Past Medical History:  Diagnosis Date   Bronchitis    Cataract    had sx    Hypertension    Rheumatoid arthritis ([place])    Thyroid disease    UTI (urinary tract infection)    Allergies  Allergen Reactions   Morphine And Related Other (See Comments)    Fast heart beat   Ciprofloxacin Rash   Oxycodone Palpitations   Tape Dermatitis    Social History   Socioeconomic History   Marital status: Married    Spouse name: Not on file   Number of children: Not on file   Years of education: Not on file   Highest education level: Not on file  Occupational History   Not on file  Tobacco Use   Smoking status: Former    Packs/day: 0.75    Years: 25.00    Total pack years: 18.75    Types: Cigarettes    Quit date: 2010    Years since quitting: 14.0   Smokeless tobacco: Never  Vaping Use   Vaping Use: Never used  Substance and Sexual Activity   Alcohol use: No    Alcohol/week: 0.0 standard drinks of alcohol   Drug use: No   Sexual  activity: Yes    Partners: Male    Birth control/protection: Other-see comments    Comment: 1st intercourse- 20, partners- 2, married- 43 yrs   Other Topics Concern   Not on file  Social History Narrative   Right handed   One story   No caffeine x 2 weeks   Social Determinants of Health   Financial Resource Strain: Not on file  Food Insecurity: No Food Insecurity (08/31/2022)   Hunger Vital Sign    Worried About Running Out of Food in the Last Year: Never true    [place] in the Last  Year: Never true  Transportation Needs: No Transportation Needs (08/31/2022)   PRAPARE - Hydrologist (Medical): No    Lack of Transportation (Non-Medical): No  Physical Activity: Not on file  Stress: Not on file  Social Connections: Not on file   Vitals:   01/26/23 1530  BP: 120/70  Pulse: 94  Resp: 12  SpO2: 99%   Body mass index is 29.26 kg/m.  Physical Exam Vitals and nursing note reviewed.  Constitutional:      General: She is not in acute distress.    Appearance: She is well-developed.  HENT:     Head: Normocephalic and atraumatic.     Mouth/Throat:     Mouth: Mucous membranes are moist.     Pharynx: Oropharynx is clear.  Eyes:     Conjunctiva/sclera: Conjunctivae normal.  Cardiovascular:     Rate and Rhythm: Normal rate and regular rhythm.     Pulses:          Dorsalis pedis pulses are 2+ on the right side and 2+ on the left side.     Heart sounds: No murmur heard. Pulmonary:     Effort: Pulmonary effort is normal. No respiratory distress.     Breath sounds: Normal breath sounds.  Abdominal:     Palpations: Abdomen is soft. There is no hepatomegaly or mass.     Tenderness: There is no abdominal tenderness.  Musculoskeletal:     Thoracic back: No spasms, tenderness or bony tenderness.  Lymphadenopathy:     Cervical: No cervical adenopathy.  Skin:    General: Skin is warm.     Findings: No erythema or rash.  Neurological:     General: No focal deficit present.     Mental Status: She is alert and oriented to person, place, and time.     Cranial Nerves: No cranial nerve deficit.     Gait: Gait normal.  Psychiatric:        Mood and Affect: Mood and affect normal.   ASSESSMENT AND PLAN:  Tracey Wood was seen today for back pain.  Diagnoses and all orders for this visit:  Upper back pain on right side Assessment & Plan: We discussed possible etiologies. Hx and examination today do not suggest a serious process. It seems to be deep  musculoskeletal pain, not elicited during examination. Recommend topical icy hot or asper cream and local massage. Monitor for new symptoms. We could consider PT is problem does not resolved.  Orders: -     DG Chest 2 View; Future  Cough, unspecified type Assessment & Plan: Lung auscultation negative. We discussed possible causes, ? Post viral,allergies,and GERG among some to be considered. CXR ordered. Monitor for new symptoms. F/U as needed.  Orders: -     DG Chest 2 View; Future  Rheumatoid arthritis involving multiple sites with positive rheumatoid factor (  [place]) Assessment & Plan: Symptoms have improved, she is not longer on Orencia. Follows with rheumatologist.   Atherosclerosis of aorta [facility]) Assessment & Plan: She is not on statin at this time. Last LDL 71 in 08/2022.    Return in about 3 months (around 04/26/2023) for chronic problems.  Boyce Keltner G. Martinique, MD  [facility]. [place] office.

## 2023-01-26 ENCOUNTER — Encounter: Payer: Self-pay | Admitting: Family Medicine

## 2023-01-26 ENCOUNTER — Ambulatory Visit: Payer: 59 | Admitting: Family Medicine

## 2023-01-26 VITALS — BP 120/70 | HR 94 | Resp 12 | Ht 62.0 in | Wt 160.0 lb

## 2023-01-26 DIAGNOSIS — R059 Cough, unspecified: Secondary | ICD-10-CM

## 2023-01-26 DIAGNOSIS — M549 Dorsalgia, unspecified: Secondary | ICD-10-CM

## 2023-01-26 DIAGNOSIS — M0579 Rheumatoid arthritis with rheumatoid factor of multiple sites without organ or systems involvement: Secondary | ICD-10-CM | POA: Diagnosis not present

## 2023-01-26 DIAGNOSIS — I700 Atherosclerosis of aorta: Secondary | ICD-10-CM

## 2023-01-27 ENCOUNTER — Ambulatory Visit (INDEPENDENT_AMBULATORY_CARE_PROVIDER_SITE_OTHER)
Admission: RE | Admit: 2023-01-27 | Discharge: 2023-01-27 | Disposition: A | Discharge status: Home / Self Care | Payer: 59 | Source: Ambulatory Visit | Attending: Family Medicine | Admitting: Family Medicine

## 2023-01-27 DIAGNOSIS — M549 Dorsalgia, unspecified: Secondary | ICD-10-CM

## 2023-01-27 DIAGNOSIS — R059 Cough, unspecified: Secondary | ICD-10-CM

## 2023-01-29 NOTE — Assessment & Plan Note (Signed)
Lung auscultation negative. We discussed possible causes, ? Post viral,allergies,and GERG among some to be considered. CXR ordered. Monitor for new symptoms. F/U as needed.

## 2023-01-29 NOTE — Assessment & Plan Note (Signed)
Symptoms have improved, she is not longer on Orencia. Follows with rheumatologist.

## 2023-01-29 NOTE — Assessment & Plan Note (Signed)
She is not on statin at this time. Last LDL 71 in 08/2022.

## 2023-01-29 NOTE — Assessment & Plan Note (Signed)
We discussed possible etiologies. Hx and examination today do not suggest a serious process. It seems to be deep musculoskeletal pain, not elicited during examination. Recommend topical icy hot or asper cream and local massage. Monitor for new symptoms. We could consider PT is problem does not resolved.

## 2023-04-05 ENCOUNTER — Telehealth: Payer: Self-pay | Admitting: *Deleted

## 2023-04-05 NOTE — Telephone Encounter (Signed)
Patient walked into office requesting appointment for constant LLQ pain. Patient reports pain 9 out of 10, symptoms started 04/04/23. Denies recent injury/trauma. Denies vaginal bleeding, vaginal odor, vaginal d/c, urinary symptoms, N/V, fever/chills. States area is tender to touch. No pain medication. LMP approximately 2023, Hx BTL.    OV scheduled for 4/11 at 0915 with Dr. Seymour Bars.  Advised patient the time is now 1645 and our office is closed. Instructed patient to go to  ER/Urgent Care for further evaluation if symptoms persist, new symptoms develop or symptoms worsen, do not wait for OV with Dr. Seymour Bars. Advised Dr. Seymour Bars is out of the office, will forward for her to review and f/u with any additional recommendations on 4/10.   Patient verbalizes understating and is agreeable.    Last AEX 09/06/22  Routing to Dr. Seymour Bars to review.

## 2023-04-06 NOTE — Telephone Encounter (Signed)
Genia Del, MD  You1 hour ago (8:51 AM)    Agree. Dr L    Reviewed and encounter closed.

## 2023-04-07 ENCOUNTER — Telehealth: Payer: Self-pay

## 2023-04-07 ENCOUNTER — Ambulatory Visit (INDEPENDENT_AMBULATORY_CARE_PROVIDER_SITE_OTHER): Payer: 59 | Admitting: Obstetrics & Gynecology

## 2023-04-07 ENCOUNTER — Encounter: Payer: Self-pay | Admitting: Obstetrics & Gynecology

## 2023-04-07 VITALS — BP 104/70 | HR 80 | Temp 98.1°F

## 2023-04-07 DIAGNOSIS — R102 Pelvic and perineal pain: Secondary | ICD-10-CM

## 2023-04-07 DIAGNOSIS — N951 Menopausal and female climacteric states: Secondary | ICD-10-CM

## 2023-04-07 DIAGNOSIS — Z9851 Tubal ligation status: Secondary | ICD-10-CM | POA: Diagnosis not present

## 2023-04-07 MED ORDER — IBUPROFEN 800 MG PO TABS
800.0000 mg | ORAL_TABLET | Freq: Three times a day (TID) | ORAL | 1 refills | Status: AC | PRN
Start: 2023-04-07 — End: 2023-04-21

## 2023-04-07 NOTE — Telephone Encounter (Signed)
Order placed

## 2023-04-07 NOTE — Telephone Encounter (Signed)
-----   Message from Genia Del, MD sent at 04/07/2023  9:43 AM EDT ----- Regarding: Schedule outside Pelvic US within 1 week Left pelvic pain x 4 days.

## 2023-04-07 NOTE — Progress Notes (Signed)
    Tracey Wood March 12, 1966 [id number]        57 y.o.  [id number] Married.  S/P BTL.  RP: Left pelvic pain x 4 days  HPI: Left pelvic pain x 4 days.  The pain sometimes increases, but never goes away completely.  Mild changes with movement.  No constipation.  No change after a BM.  No UTI Sx.  No fever.  No trauma or excess physical activity.  S/P BTL. Declines STI screen. Oligomenorrhea with menses 8 months apart in 2023. LMP 10/2022.  Last Pap Neg 08/2022.  Colono Neg in 2021.   OB History  Gravida Para Term Preterm AB Living  3 3 2 1   2   SAB IAB Ectopic Multiple Live Births  0 0 0 0 2    # Outcome Date GA Lbr Len/2nd Weight Sex Delivery Anes PTL Lv  3 Term           2 Term           1 Preterm         ND    Past medical history,surgical history, problem list, medications, allergies, family history and social history were all reviewed and documented in the EPIC chart.   Directed ROS with pertinent positives and negatives documented in the history of present illness/assessment and plan.  Exam:  Vitals:   04/07/23 0916  BP: 104/70  Pulse: 80  Temp: 98.1 F (36.7 C)  TempSrc: Oral  SpO2: 99%   General appearance:  Normal  Abdomen: Normal, soft, not distended.  No mass felt.  Tender in the left lower quadrant.  Gynecologic exam: Vulva normal.  Bimanual exam:  Uterus AV, normal volume, mobile, NT.  No Rt adnexal mass, NT.  No Lt adnexal mass felt, tender to palpation, especially with the abdominal hand.   Rectal exam normal.  U/A: Dark yellow, slightly cloudy.  Nit Neg, Pro Neg, WBC Neg, RBC 0-2, Bacteria Neg.  U. Culture pending.   Assessment/Plan:  57 y.o. G3P2102   1. Left Pelvic pain Left pelvic pain x 4 days.  The pain sometimes increases, but never goes away completely.  Mild changes with movement.  No constipation.  No change after a BM.  No UTI Sx.  No fever.  No trauma or excess physical activity.  S/P BTL. Declines STI screen. Oligomenorrhea with menses  8 months apart in 2023. LMP 10/2022.  Last Pap Neg 08/2022.  Colono Neg in 2021. Pelvic exam with Lt adnexal tenderness, no mass felt.  Non surgical abdomen.  Will schedule an external Pelvic US to further investigate.  U/A wnl, pending culture. - Urinalysis,Complete w/RFL Culture - Urine Culture - REFLEXIVE URINE CULTURE - US Transvaginal Non-OB; Future  2. Tubal ligation status  3. Perimenopause  Other orders - trimethoprim (TRIMPEX) 100 MG tablet; Take 100 mg by mouth at bedtime. - MAGNESIUM PO; Take by mouth. - ibuprofen (ADVIL) 800 MG tablet; Take 1 tablet (800 mg total) by mouth every 8 (eight) hours as needed for up to 14 days.   Genia Del MD, 9:27 AM 04/07/2023

## 2023-04-08 ENCOUNTER — Ambulatory Visit
Admission: RE | Admit: 2023-04-08 | Discharge: 2023-04-08 | Disposition: A | Discharge status: Home / Self Care | Payer: 59 | Source: Ambulatory Visit | Attending: Obstetrics & Gynecology | Admitting: Obstetrics & Gynecology

## 2023-04-08 DIAGNOSIS — R102 Pelvic and perineal pain: Secondary | ICD-10-CM

## 2023-04-09 LAB — URINALYSIS, COMPLETE W/RFL CULTURE
Bacteria, UA: NONE SEEN /HPF
Bilirubin Urine: NEGATIVE
Glucose, UA: NEGATIVE
Hyaline Cast: NONE SEEN /LPF
Ketones, ur: NEGATIVE
Leukocyte Esterase: NEGATIVE
Nitrites, Initial: NEGATIVE
Protein, ur: NEGATIVE
Specific Gravity, Urine: 1.025 (ref 1.001–1.035)
WBC, UA: NONE SEEN /HPF (ref 0–5)
pH: 5.5 (ref 5.0–8.0)

## 2023-04-09 LAB — URINE CULTURE
MICRO NUMBER:: 14811818
SPECIMEN QUALITY:: ADEQUATE

## 2023-04-09 LAB — CULTURE INDICATED

## 2023-04-13 NOTE — Telephone Encounter (Signed)
US performed on 04/08/2023. Results received/reviewed by ML and advised to pt. Will close encounter.

## 2023-04-20 ENCOUNTER — Encounter: Payer: Self-pay | Admitting: Obstetrics & Gynecology

## 2023-04-20 ENCOUNTER — Ambulatory Visit: Payer: 59 | Admitting: Obstetrics & Gynecology

## 2023-04-20 VITALS — BP 120/78 | HR 86

## 2023-04-20 DIAGNOSIS — Z9851 Tubal ligation status: Secondary | ICD-10-CM | POA: Diagnosis not present

## 2023-04-20 DIAGNOSIS — R102 Pelvic and perineal pain: Secondary | ICD-10-CM | POA: Diagnosis not present

## 2023-04-20 DIAGNOSIS — N951 Menopausal and female climacteric states: Secondary | ICD-10-CM | POA: Diagnosis not present

## 2023-04-20 NOTE — Progress Notes (Signed)
Tracey Wood [birth year] [id number]        57 y.o.  [id number] Married.  S/P BTL.   RP: Left pelvic pain intermittently x early 03/2023   HPI: Probably left lower abdominal wall pain improved x last visit.  Patient seen on 04/07/23: Left pelvic pain x 4 days.  The pain sometimes increases, but never goes away completely.  Mild changes with movement.  No constipation.  No change after a BM.  No UTI Sx.  No fever.  No trauma or excess physical activity.  S/P BTL. Declines STI screen. Oligomenorrhea with menses 8 months apart in 2023. LMP 10/27/2022. Having more and more hot flushes.  Last Pap Neg 08/2022.  Colono Neg in 2021.   OB History  Gravida Para Term Preterm AB Living  SAB IAB Ectopic Multiple Live Births  0 0 0 0 2    # Outcome Date GA Lbr Len/2nd Weight Sex Delivery Anes PTL Lv  3 Term           2 Term           1 Preterm         ND    Past medical history,surgical history, problem list, medications, allergies, family history and social history were all reviewed and documented in the EPIC chart.   Directed ROS with pertinent positives and negatives documented in the history of present illness/assessment and plan.  Exam:  Vitals:   04/20/23 1342  BP: 120/78  Pulse: 86  SpO2: 97%   General appearance:  Normal  Pelvic US 04/08/23: Both transabdominal and transvaginal ultrasound examinations of the pelvis were performed. Transabdominal technique was performed for global imaging of the pelvis including uterus, ovaries, adnexal regions, and pelvic cul-de-sac. It was necessary to proceed with endovaginal exam following the transabdominal exam to visualize the uterus endometrium ovaries.   COMPARISON:  CT 08/29/2022   FINDINGS: Uterus   Measurements: 7.9 by 3.5 x 4.5 cm = volume: 64.7 mL. Small uterine fibroids. Subserosal posterior uterine corpus fibroid measuring 12 x 11 mm. Anterior subserosal fundal fibroid measuring 7 x 8 x 8 mm.    Endometrium   Thickness: 6.6 mm.  No focal abnormality visualized.   Right ovary   Measurements: 2.9 x 1.7 x 1.8 cm = volume: 4.5 mL. Normal appearance/no adnexal mass.   Left ovary   Measurements: 1.9 x 1 x 1.1 cm = volume: 1.1 mL. Normal appearance/no adnexal mass.   Other findings   No abnormal free fluid.   IMPRESSION: Small uterine fibroids. Otherwise negative pelvic ultrasound.     Electronically Signed   By: Jasmine Pang M.D.   On: 04/08/2023 16:53     Assessment/Plan:  57 y.o. G3P2102   1. Left Pelvic pain Probably left lower abdominal wall pain improved x last visit.  Patient seen on 04/07/23: Left pelvic pain x 4 days.  The pain sometimes increases, but never goes away completely.  Mild changes with movement.  No constipation.  No change after a BM.  No UTI Sx.  No fever.  No trauma or excess physical activity.  S/P BTL. Declines STI screen. Oligomenorrhea with menses 8 months apart in 2023. LMP 10/27/2022. Having more and more hot flushes.  Last Pap Neg 08/2022.  Colono Neg in 2021.  Pelvic US findings thoroughly reviewed with patient.  Patient reassured that her left ovary is very small with no cyst or mass.  No FF in  the pelvis.    2. Tubal ligation status  3. Perimenopause  Oligomenorrhea with menses 8 months apart in 2023. LMP 10/27/2022. Having more and more hot flushes.  Endometrial line normal at 6.6 mm.  May try Ashwagandha for hot flushes.  Genia Del MD, 1:58 PM 04/20/2023

## 2023-06-06 NOTE — Progress Notes (Signed)
ACUTE VISIT Chief Complaint  Patient presents with   Arthritis   Thyroid Problem   HPI: Tracey Wood is a 57 y.o. female with PMHx significant for RA,GERD,and HTN here today complaining of worsening  2 weeks of generalized arthralgias, joint edema ,and erythema.  Arthritis Presents for follow-up visit. The disease course has been worsening. She complains of pain, stiffness and joint swelling. Affected locations include the left shoulder, right shoulder, right DIP, left DIP, right knee, left knee, left hip and right hip. Her pain is at a severity of 8/10. Associated symptoms include fatigue, pain at night and pain while resting. Pertinent negatives include no diarrhea, dry eyes, dry mouth, dysuria, fever, rash, Raynaud's syndrome, uveitis or weight loss. Her past medical history is significant for rheumatoid arthritis.  Pain is affecting her sleep. RA, she is not longer on Orencia.  She has seen her rheumatologist and started on Meloxicam. rheumatologist.  She has also been experiencing fatigue for the past two weeks.  -She had blood work done at her rheumatologist's office. 25 OH vit D is 21.7. She has been taking 3,000 units of vitamin D  -Her TSH level is 4.9, CRP is 21. Her CMP shows slightly high glucose at 122 (not fasting), calcium is elevated at 10.7, and ALT elevated at 55. Rest of CMP and CBC normal.  She is concerned about all these symptoms, she believes that if she changes her diet, gave up sugar and gluten, her symptoms will greatly improved.  HgA1C was 6.3 in 08/2022.  She has already eliminated adding sugar to her coffee and stopped consuming flour.  She has increased physical activity.  Review of Systems  Constitutional:  Positive for fatigue. Negative for fever and weight loss.  HENT:  Negative for mouth sores and sore throat.   Respiratory:  Negative for cough, shortness of breath and wheezing.   Cardiovascular:  Negative for chest pain,  palpitations and leg swelling.  Gastrointestinal:  Negative for abdominal pain, diarrhea and vomiting.  Genitourinary:  Negative for decreased urine volume, dysuria and hematuria.  Musculoskeletal:  Positive for arthralgias, arthritis, joint swelling, myalgias and stiffness.  Skin:  Negative for rash.  Neurological:  Negative for syncope, weakness and headaches.  See other pertinent positives and negatives in HPI.  Current Outpatient Medications on File Prior to Visit  Medication Sig Dispense Refill   acetaminophen (TYLENOL) 500 MG tablet Take 1,000 mg by mouth daily as needed for moderate pain.     amLODipine-benazepril (LOTREL) 5-20 MG capsule TAKE 1 CAPSULE BY MOUTH EVERY DAY 90 capsule 2   MAGNESIUM PO Take by mouth.     trimethoprim (TRIMPEX) 100 MG tablet Take 100 mg by mouth at bedtime.     No current facility-administered medications on file prior to visit.   Past Medical History:  Diagnosis Date   Bronchitis    Cataract    had sx    Hypertension    Rheumatoid arthritis (HCC)    Thyroid disease    UTI (urinary tract infection)    Allergies  Allergen Reactions   Morphine And Codeine Other (See Comments)    Fast heart beat   Ciprofloxacin Rash   Oxycodone Palpitations   Tape Dermatitis    Social History   Socioeconomic History   Marital status: Married    Spouse name: Not on file   Number of children: Not on file   Years of education: Not on file   Highest education level: Not on file  Occupational History   Not on file  Tobacco Use   Smoking status: Former    Packs/day: 0.75    Years: 25.00    Additional pack years: 0.00    Total pack years: 18.75    Types: Cigarettes    Quit date: 2010    Years since quitting: 14.4   Smokeless tobacco: Never  Vaping Use   Vaping Use: Never used  Substance and Sexual Activity   Alcohol use: No    Alcohol/week: 0.0 standard drinks of alcohol   Drug use: No   Sexual activity: Yes    Partners: Male    Birth  control/protection: Surgical    Comment: 1st intercourse- 20, partners- 2, btl  Other Topics Concern   Not on file  Social History Narrative   Right handed   One story   No caffeine x 2 weeks   Social Determinants of Health   Financial Resource Strain: Not on file  Food Insecurity: No Food Insecurity (08/31/2022)   Hunger Vital Sign    Worried About Running Out of Food in the Last Year: Never true    Ran Out of Food in the Last Year: Never true  Transportation Needs: No Transportation Needs (08/31/2022)   PRAPARE - Administrator, Civil Service (Medical): No    Lack of Transportation (Non-Medical): No  Physical Activity: Not on file  Stress: Not on file  Social Connections: Not on file   Vitals:   06/07/23 0846  BP: 120/72  Pulse: 75  Resp: 12  Temp: 98.3 F (36.8 C)  SpO2: 98%   Body mass index is 28.9 kg/m.  Physical Exam Vitals and nursing note reviewed.  Constitutional:      General: She is not in acute distress.    Appearance: She is well-developed.  HENT:     Head: Normocephalic and atraumatic.     Mouth/Throat:     Mouth: Mucous membranes are moist.     Pharynx: Oropharynx is clear.  Eyes:     Conjunctiva/sclera: Conjunctivae normal.  Cardiovascular:     Rate and Rhythm: Normal rate and regular rhythm.     Pulses:          Dorsalis pedis pulses are 2+ on the right side and 2+ on the left side.     Heart sounds: No murmur heard. Pulmonary:     Effort: Pulmonary effort is normal. No respiratory distress.     Breath sounds: Normal breath sounds.  Abdominal:     Palpations: Abdomen is soft. There is no hepatomegaly or mass.     Tenderness: There is no abdominal tenderness.  Musculoskeletal:     Comments: DIP nodules and tenderness. No signs of synovitis.  Lymphadenopathy:     Cervical: No cervical adenopathy.  Skin:    General: Skin is warm.     Findings: No erythema or rash.  Neurological:     General: No focal deficit present.      Mental Status: She is alert and oriented to person, place, and time.     Cranial Nerves: No cranial nerve deficit.     Gait: Gait normal.  Psychiatric:        Mood and Affect: Mood and affect normal.   ASSESSMENT AND PLAN:  Ms. Vongunten was seen today with concerned about joint pain and recent abnormal labs.  Lab Results  Component Value Date   TSH 5.28 06/07/2023   Lab Results  Component Value Date   HGBA1C 6.3  06/07/2023   Hypercalcemia Mild. Possible etiologies discussed. Further recommendations according to lab results.  -     PTH, intact and calcium; Future  Vitamin D deficiency, unspecified Assessment & Plan: She is on OTC Vit D 3000 U daily and last 25 OH vit D still low at 21. Recommend changing to Ergocalciferol 50,000 U weekly x 8 weeks then q 2 weeks.  Orders: -     Vitamin D (Ergocalciferol); 1 cap weekly for 8 weeks then every 2 weeks.  Dispense: 12 capsule; Refill: 2  Polyarthralgia RA vs OA. CRP was mildly elevated. She is on Meloxicam, some side effects discussed. Following with rheumatologist.  Abnormal TSH Assessment & Plan: Mild, TSH 4.9 on 06/02/23. TSH was 1.3 in 08/2022. Will repeat thyroid panel today and further recommendations will be given accordingly.  Orders: -     T4, free; Future -     T3, free; Future -     TSH; Future  Prediabetes Assessment & Plan: Consistency with a healthy life style encouraged for diabetes prevention. Further recommendations according to HgA1C.  Orders: -     Hemoglobin A1c; Future  Encounter for HCV screening test for low risk patient -     Hepatitis C antibody; Future  Elevated ALT measurement Mild. Possible causes discussed. Improving her diet and wt loss recommended. Will plan on repeating LFT's in 3 months.  I spent a total of 41 minutes in both face to face and non face to face activities for this visit on the date of this encounter. During this time history was obtained and documented,  examination was performed, prior labs reviewed, and assessment/plan discussed.  Return in about 3 months (around 09/07/2023).  Khya Halls G. [place], MD  [facility]. [place] office.

## 2023-06-07 ENCOUNTER — Encounter: Payer: Self-pay | Admitting: Family Medicine

## 2023-06-07 ENCOUNTER — Ambulatory Visit: Payer: 59 | Admitting: Family Medicine

## 2023-06-07 DIAGNOSIS — E559 Vitamin D deficiency, unspecified: Secondary | ICD-10-CM

## 2023-06-07 DIAGNOSIS — M2550 Pain in unspecified joint: Secondary | ICD-10-CM

## 2023-06-07 DIAGNOSIS — R7401 Elevation of levels of liver transaminase levels: Secondary | ICD-10-CM

## 2023-06-07 DIAGNOSIS — Z1159 Encounter for screening for other viral diseases: Secondary | ICD-10-CM

## 2023-06-07 DIAGNOSIS — R7303 Prediabetes: Secondary | ICD-10-CM | POA: Diagnosis not present

## 2023-06-07 DIAGNOSIS — R7989 Other specified abnormal findings of blood chemistry: Secondary | ICD-10-CM | POA: Diagnosis not present

## 2023-06-07 LAB — HEMOGLOBIN A1C: Hgb A1c MFr Bld: 6.3 % (ref 4.6–6.5)

## 2023-06-07 LAB — T3, FREE: T3, Free: 3.1 pg/mL (ref 2.3–4.2)

## 2023-06-07 LAB — EXTRA SPECIMEN

## 2023-06-07 LAB — T4, FREE: Free T4: 0.79 ng/dL (ref 0.60–1.60)

## 2023-06-07 LAB — TSH: TSH: 5.28 u[IU]/mL (ref 0.35–5.50)

## 2023-06-07 MED ORDER — VITAMIN D (ERGOCALCIFEROL) 1.25 MG (50000 UNIT) PO CAPS
ORAL_CAPSULE | ORAL | 2 refills | Status: DC
Start: 2023-06-07 — End: 2023-08-09

## 2023-06-07 MED ORDER — VITAMIN D (ERGOCALCIFEROL) 1.25 MG (50000 UNIT) PO CAPS: ORAL_CAPSULE | ORAL | 2 refills | Status: DC

## 2023-06-07 NOTE — Assessment & Plan Note (Signed)
Consistency with a healthy life style encouraged for diabetes prevention. Further recommendations according to HgA1C.

## 2023-06-07 NOTE — Assessment & Plan Note (Signed)
She is on OTC Vit D 3000 U daily and last 25 OH vit D still low at 21. Recommend changing to Ergocalciferol 50,000 U weekly x 8 weeks then q 2 weeks.

## 2023-06-07 NOTE — Assessment & Plan Note (Signed)
Mild, TSH 4.9 on 06/02/23. TSH was 1.3 in 08/2022. Will repeat thyroid panel today and further recommendations will be given accordingly.

## 2023-06-07 NOTE — Patient Instructions (Signed)
A few things to remember from today's visit:  Hypercalcemia - Plan: PTH, Intact and Calcium, CANCELED: PTH, Intact and Calcium  Vitamin D deficiency, unspecified  Polyarthralgia  Abnormal TSH - Plan: T4, free, T3, free, TSH  Prediabetes - Plan: Hemoglobin A1c  Ergocalciferol 50,000 U 1 cap semanal por 8 semanas y luego cada 2 semanas.

## 2023-06-08 LAB — PTH, INTACT AND CALCIUM
Calcium: 10.3 mg/dL (ref 8.6–10.4)
PTH: 42 pg/mL (ref 16–77)

## 2023-06-08 LAB — HEPATITIS C ANTIBODY: Hepatitis C Ab: NONREACTIVE

## 2023-06-08 LAB — EXTRA SPECIMEN

## 2023-08-08 ENCOUNTER — Other Ambulatory Visit: Payer: Self-pay | Admitting: Family Medicine

## 2023-08-08 DIAGNOSIS — E559 Vitamin D deficiency, unspecified: Secondary | ICD-10-CM

## 2023-08-11 ENCOUNTER — Encounter (INDEPENDENT_AMBULATORY_CARE_PROVIDER_SITE_OTHER): Payer: Self-pay

## 2023-09-07 ENCOUNTER — Ambulatory Visit: Payer: 59 | Admitting: Family Medicine

## 2023-09-07 NOTE — Progress Notes (Addendum)
HPI: Tracey Wood is a 57 y.o. female, who is here today for follow-up. She was last seen on 06/07/2023, when she was complaining about polyarthralgia and myalgias. She follows with rheumatology regularly, last visit on 07/06/2023. Joint pain improved with dietary changes, decreased sugar and gluten intake. Still having severe shoulder pain, L>R. Reports calcifications seen on X ray, L>R. Completed oral steroid taper. She is following with ortho and sent to surgeon, she thinks she is going to have an MRI done.  She is on Meloxicam, which doe snot seem to help. No associated numbness,tingling,or weakness. Neck and upper back pain, L>R.  Hypertension: Currently she is on amlodipine-benazepril 5-20 mg daily. Negative for unusual or severe headache, visual changes, exertional chest pain, dyspnea,  focal weakness, or edema. BP's at home 110's-120/70-80. Lab Results  Component Value Date   CREATININE 0.77 09/13/2022   BUN 17 09/13/2022   NA 135 09/13/2022   K 4.1 09/13/2022   CL 102 09/13/2022   CO2 23 09/13/2022   Vitamin D deficiency: She is on ergocalciferol 50,000 units weekly, she was supposed to be taking it every 2 weeks after completing 8 weekly doses. Last 25 OH vit D was 21.7 in 05/2023.  Prediabetes: Last HgA1C was 6.3 in 05/2022. Negative for polydipsia,polyuria, or polyphagia.  Review of Systems  Constitutional:  Negative for appetite change, chills and fever.  HENT:  Negative for mouth sores and sore throat.   Respiratory:  Negative for cough and wheezing.   Gastrointestinal:  Negative for abdominal pain, nausea and vomiting.  Genitourinary:  Negative for decreased urine volume, dysuria and hematuria.  Musculoskeletal:  Positive for arthralgias and neck pain.  Skin:  Negative for rash.  Neurological:  Negative for syncope and facial asymmetry.  See other pertinent positives and negatives in HPI.  Current Outpatient Medications on File Prior to Visit   Medication Sig Dispense Refill   acetaminophen (TYLENOL) 500 MG tablet Take 1,000 mg by mouth daily as needed for moderate pain.     amLODipine-benazepril (LOTREL) 5-20 MG capsule TAKE 1 CAPSULE BY MOUTH EVERY DAY 90 capsule 2   MAGNESIUM PO Take by mouth.     trimethoprim (TRIMPEX) 100 MG tablet Take 100 mg by mouth at bedtime.     Vitamin D, Ergocalciferol, (DRISDOL) 1.25 MG (50000 UNIT) CAPS capsule TAKE 1 CAPSULE BY MOUTH ONCE WEEKLY FOR 8 WEEKS THEN TAKE 1 CAPSULE EVERY 2 WEEKS THEREAFTER AS DIRECTED 4 capsule 3   No current facility-administered medications on file prior to visit.   Past Medical History:  Diagnosis Date   Bronchitis    Cataract    had sx    Hypertension    Rheumatoid arthritis (HCC)    Thyroid disease    UTI (urinary tract infection)    Allergies  Allergen Reactions   Morphine And Codeine Other (See Comments)    Fast heart beat   Ciprofloxacin Rash   Oxycodone Palpitations   Tape Dermatitis    Social History   Socioeconomic History   Marital status: Married    Spouse name: Not on file   Number of children: Not on file   Years of education: Not on file   Highest education level: Not on file  Occupational History   Not on file  Tobacco Use   Smoking status: Former    Current packs/day: 0.00    Average packs/day: 0.8 packs/day for 25.0 years (18.8 ttl pk-yrs)    Types: Cigarettes    Start date:  89    Quit date: 2010    Years since quitting: 14.7   Smokeless tobacco: Never  Vaping Use   Vaping status: Never Used  Substance and Sexual Activity   Alcohol use: No    Alcohol/week: 0.0 standard drinks of alcohol   Drug use: No   Sexual activity: Yes    Partners: Male    Birth control/protection: Surgical    Comment: 1st intercourse- 20, partners- 2, btl  Other Topics Concern   Not on file  Social History Narrative   Right handed   One story   No caffeine x 2 weeks   Social Determinants of Health   Financial Resource Strain: Not on  file  Food Insecurity: No Food Insecurity (08/31/2022)   Hunger Vital Sign    Worried About Running Out of Food in the Last Year: Never true    Ran Out of Food in the Last Year: Never true  Transportation Needs: No Transportation Needs (08/31/2022)   PRAPARE - Administrator, Civil Service (Medical): No    Lack of Transportation (Non-Medical): No  Physical Activity: Not on file  Stress: Not on file  Social Connections: Unknown (05/07/2022)   [facility]   Social Network    Social Network: Not on file   Vitals:   09/09/23 1008  BP: 136/72  Pulse: 100  Resp: 12  Temp: 98.7 F (37.1 C)  SpO2: 98%   Body mass index is 28.97 kg/m.  Physical Exam Vitals and nursing note reviewed.  Constitutional:      General: She is not in acute distress.    Appearance: She is well-developed.  HENT:     Head: Normocephalic and atraumatic.     Mouth/Throat:     Mouth: Mucous membranes are moist.     Pharynx: Oropharynx is clear.  Eyes:     Conjunctiva/sclera: Conjunctivae normal.  Cardiovascular:     Rate and Rhythm: Normal rate and regular rhythm.     Pulses:          Dorsalis pedis pulses are 2+ on the right side and 2+ on the left side.     Heart sounds: No murmur heard. Pulmonary:     Effort: Pulmonary effort is normal. No respiratory distress.     Breath sounds: Normal breath sounds.  Abdominal:     Palpations: Abdomen is soft. There is no hepatomegaly or mass.     Tenderness: There is no abdominal tenderness.  Musculoskeletal:     Left shoulder: Tenderness present. Decreased range of motion.     Cervical back: Muscular tenderness present.  Lymphadenopathy:     Cervical: No cervical adenopathy.  Skin:    General: Skin is warm.     Findings: No erythema or rash.  Neurological:     General: No focal deficit present.     Mental Status: She is alert and oriented to person, place, and time.     Cranial Nerves: No cranial nerve deficit.      Gait: Gait normal.  Psychiatric:        Mood and Affect: Mood and affect normal.   ASSESSMENT AND PLAN:  [name] was seen today for medical management of chronic issues.  Diagnoses and all orders for this visit:  Orders Placed This Encounter  Procedures   VITAMIN D 25 Hydroxy (Vit-D Deficiency, Fractures)   Hemoglobin A1c   Comprehensive metabolic panel   Lab Results  Component Value Date  NA 138 09/09/2023   CL 102 09/09/2023   K 3.4 (L) 09/09/2023   CO2 28 09/09/2023   BUN 14 09/09/2023   CREATININE 0.72 09/09/2023   GFR 94.42 09/09/2023   CALCIUM 9.4 09/09/2023   PHOS 2.9 08/30/2022   ALBUMIN 3.9 09/09/2023   GLUCOSE 98 09/09/2023   Lab Results  Component Value Date   ALT 20 09/09/2023   AST 14 09/09/2023   ALKPHOS 97 09/09/2023   BILITOT 0.3 09/09/2023   Lab Results  Component Value Date   HGBA1C 6.1 09/09/2023   Prediabetes Assessment & Plan: Last hemoglobin A1c was 6.3 in 12/2022. Encouraged consistency with a healthy lifestyle for diabetes prevention. Further recommendation will be given according to hemoglobin A1c result.  Orders: -     Hemoglobin A1c; Future  Vitamin D deficiency, unspecified Assessment & Plan: Currently she is on ergocalciferol 50,000 units weekly. Further recommendation will be given according to 25 OH vitamin D result.  Orders: -     VITAMIN D 25 Hydroxy (Vit-D Deficiency, Fractures); Future  Hypertension, essential, benign Assessment & Plan: BP adequately controlled. Continue amlodipine-benazepril 5-20 mg daily and low-salt diet. Continue monitoring BP regularly. Eye exam is current. Follow-up in 6 months.  Orders: -     Comprehensive metabolic panel; Future -     amLODIPine Besy-Benazepril HCl; Take 1 capsule by mouth daily.  Dispense: 90 capsule; Refill: 2  Chronic pain of both shoulders  Following with ortho. Return in about 6 months (around 03/08/2024) for chronic problems.  Tracey Peabody G. [place], MD  [facility]. [place] office.

## 2023-09-08 ENCOUNTER — Ambulatory Visit: Payer: 59 | Admitting: Obstetrics & Gynecology

## 2023-09-09 ENCOUNTER — Encounter: Payer: Self-pay | Admitting: Radiology

## 2023-09-09 ENCOUNTER — Ambulatory Visit: Payer: 59 | Admitting: Family Medicine

## 2023-09-09 ENCOUNTER — Ambulatory Visit (INDEPENDENT_AMBULATORY_CARE_PROVIDER_SITE_OTHER): Payer: 59 | Admitting: Radiology

## 2023-09-09 ENCOUNTER — Encounter: Payer: Self-pay | Admitting: Family Medicine

## 2023-09-09 VITALS — BP 136/72 | HR 100 | Temp 98.7°F | Resp 12 | Ht 62.0 in | Wt 158.4 lb

## 2023-09-09 VITALS — BP 124/72 | HR 68 | Ht 62.0 in | Wt 159.8 lb

## 2023-09-09 DIAGNOSIS — M25512 Pain in left shoulder: Secondary | ICD-10-CM

## 2023-09-09 DIAGNOSIS — E559 Vitamin D deficiency, unspecified: Secondary | ICD-10-CM

## 2023-09-09 DIAGNOSIS — I10 Essential (primary) hypertension: Secondary | ICD-10-CM

## 2023-09-09 DIAGNOSIS — R7303 Prediabetes: Secondary | ICD-10-CM

## 2023-09-09 DIAGNOSIS — G8929 Other chronic pain: Secondary | ICD-10-CM

## 2023-09-09 DIAGNOSIS — Z01419 Encounter for gynecological examination (general) (routine) without abnormal findings: Secondary | ICD-10-CM | POA: Diagnosis not present

## 2023-09-09 DIAGNOSIS — N951 Menopausal and female climacteric states: Secondary | ICD-10-CM | POA: Diagnosis not present

## 2023-09-09 DIAGNOSIS — M25511 Pain in right shoulder: Secondary | ICD-10-CM

## 2023-09-09 LAB — COMPREHENSIVE METABOLIC PANEL
ALT: 20 U/L (ref 0–35)
AST: 14 U/L (ref 0–37)
Albumin: 3.9 g/dL (ref 3.5–5.2)
Alkaline Phosphatase: 97 U/L (ref 39–117)
BUN: 14 mg/dL (ref 6–23)
CO2: 28 meq/L (ref 19–32)
Calcium: 9.4 mg/dL (ref 8.4–10.5)
Chloride: 102 meq/L (ref 96–112)
Creatinine, Ser: 0.72 mg/dL (ref 0.40–1.20)
GFR: 94.42 mL/min (ref >60.00)
Glucose, Bld: 98 mg/dL (ref 70–99)
Potassium: 3.4 meq/L — ABNORMAL LOW (ref 3.5–5.1)
Sodium: 138 meq/L (ref 135–145)
Total Bilirubin: 0.3 mg/dL (ref 0.2–1.2)
Total Protein: 7.7 g/dL (ref 6.0–8.3)

## 2023-09-09 LAB — VITAMIN D 25 HYDROXY (VIT D DEFICIENCY, FRACTURES): VITD: 31.76 ng/mL (ref 30.00–100.00)

## 2023-09-09 LAB — HEMOGLOBIN A1C: Hgb A1c MFr Bld: 6.1 % (ref 4.6–6.5)

## 2023-09-09 MED ORDER — VITAMIN D (ERGOCALCIFEROL) 1.25 MG (50000 UNIT) PO CAPS: ORAL_CAPSULE | ORAL | 1 refills | Status: DC

## 2023-09-09 MED ORDER — PROGESTERONE MICRONIZED 100 MG PO CAPS
100.0000 mg | ORAL_CAPSULE | Freq: Every day | ORAL | 0 refills | Status: AC
Start: 2023-09-09 — End: ?

## 2023-09-09 MED ORDER — AMLODIPINE BESY-BENAZEPRIL HCL 5-20 MG PO CAPS: 1.0000 | ORAL_CAPSULE | Freq: Every day | ORAL | 2 refills | Status: DC

## 2023-09-09 MED ORDER — ESTRADIOL 0.025 MG/24HR TD PTTW
1.0000 | MEDICATED_PATCH | TRANSDERMAL | 0 refills | Status: DC
Start: 2023-09-12 — End: 2023-10-05

## 2023-09-09 NOTE — Progress Notes (Signed)
Tracey Wood [birth year] [id number]   History: Postmenopausal 57 y.o. presents for annual exam. Spanish speaking interpreter presents. Has a PCP. C/o hot flashes, night sweats, frozen shoulder, mood swings. Interested in starting HRT.   Gynecologic History Postmenopausal Last Pap: 2023. Results were: normal Last mammogram: 12/2022. Results were: normal Last colonoscopy: 2021   Obstetric History OB History  Gravida Para Term Preterm AB Living  3 3 2 1   2   SAB IAB Ectopic Multiple Live Births  0 0 0 0 2    # Outcome Date GA Lbr Len/2nd Weight Sex Type Anes PTL Lv  3 Term           2 Term           1 Preterm         ND     The following portions of the patient's history were reviewed and updated as appropriate: allergies, current medications, past family history, past medical history, past social history, past surgical history, and problem list.  Review of Systems Pertinent items noted in HPI and remainder of comprehensive ROS otherwise negative.  Past medical history, past surgical history, family history and social history were all reviewed and documented in the EPIC chart.  Exam:  Vitals:   09/09/23 1417  BP: 124/72  Pulse: 68  SpO2: 100%  Weight: 159 lb 12.8 oz (72.5 kg)  Height: 5\' 2"  (1.575 m)   Body mass index is 29.23 kg/m.  General appearance:  Normal Thyroid:  Symmetrical, normal in size, without palpable masses or nodularity. Respiratory  Auscultation:  Clear without wheezing or rhonchi Cardiovascular  Auscultation:  Regular rate, without rubs, murmurs or gallops  Edema/varicosities:  Not grossly evident Abdominal  Soft,nontender, without masses, guarding or rebound.  Liver/spleen:  No organomegaly noted  Hernia:  None appreciated  Skin  Inspection:  Grossly normal Breasts: Examined lying and sitting.   Right: Without masses, retractions, nipple discharge or axillary adenopathy.   Left: Without masses, retractions, nipple discharge or  axillary adenopathy. Genitourinary   Inguinal/mons:  Normal without inguinal adenopathy  External genitalia:  Normal appearing vulva with no masses, tenderness, or lesions  BUS/Urethra/Skene's glands:  Normal  Vagina:  Normal appearing with normal color and discharge, no lesions. Atrophy: mild   Cervix:  Normal appearing without discharge or lesions  Uterus:  Normal in size, shape and contour.  Midline and mobile, nontender  Adnexa/parametria:     Rt: Normal in size, without masses or tenderness.   Lt: Normal in size, without masses or tenderness.  Anus and perineum: Normal    Carolynn Serve, CMA present for exam  Assessment/Plan:   1. Well woman exam with routine gynecological exam Pap 2026 Mammogram yearly PCP for labs  2. Menopausal syndrome Risks and benefits reviewed, discussed proper use. Will f/u 3 months - estradiol (VIVELLE-DOT) 0.025 MG/24HR; Place 1 patch onto the skin 2 (two) times a week.  Dispense: 24 patch; Refill: 0 - progesterone (PROMETRIUM) 100 MG capsule; Take 1 capsule (100 mg total) by mouth daily.  Dispense: 90 capsule; Refill: 0    Discussed SBE, colonoscopy and DEXA screening as directed. Recommend of exercise weekly, including weight bearing exercise. Encouraged the use of seatbelts and sunscreen.  Return in 1 year for annual or sooner prn.  Arlie Solomons B WHNP-BC, 2:24 PM 09/09/2023

## 2023-09-09 NOTE — Assessment & Plan Note (Signed)
Last hemoglobin A1c was 6.3 in 12/2022. Encouraged consistency with a healthy lifestyle for diabetes prevention. Further recommendation will be given according to hemoglobin A1c result.

## 2023-09-09 NOTE — Assessment & Plan Note (Addendum)
BP adequately controlled. Continue amlodipine-benazepril 5-20 mg daily and low-salt diet. Continue monitoring BP regularly. Eye exam is current. Follow-up in 6 months.

## 2023-09-09 NOTE — Assessment & Plan Note (Signed)
Currently she is on ergocalciferol 50,000 units weekly. Further recommendation will be given according to 25 OH vitamin D result.

## 2023-10-05 ENCOUNTER — Other Ambulatory Visit: Payer: Self-pay | Admitting: Radiology

## 2023-10-05 DIAGNOSIS — N951 Menopausal and female climacteric states: Secondary | ICD-10-CM

## 2023-10-05 NOTE — Telephone Encounter (Signed)
Med refill request: estradiol patch 0.025mg  #8 (not #24) Last AEX: 09/09/23 Next OV: 12/09/23 Next AEX: 09/13/24 Last MMG (if hormonal med) 01/13/23 Refill authorized: estradiol patch 0.025mg  #8 with 2 refills.  Sent to provider for review.

## 2023-10-07 ENCOUNTER — Other Ambulatory Visit: Payer: Self-pay

## 2023-10-10 LAB — SURGICAL PATHOLOGY

## 2023-10-16 ENCOUNTER — Encounter (HOSPITAL_COMMUNITY): Payer: Self-pay | Admitting: Emergency Medicine

## 2023-10-16 ENCOUNTER — Ambulatory Visit (HOSPITAL_COMMUNITY)
Admission: EM | Admit: 2023-10-16 | Discharge: 2023-10-16 | Disposition: A | Discharge status: Home / Self Care | Payer: 59 | Attending: Family Medicine | Admitting: Family Medicine

## 2023-10-16 DIAGNOSIS — T22132A Burn of first degree of left upper arm, initial encounter: Secondary | ICD-10-CM

## 2023-10-16 MED ORDER — SILVER SULFADIAZINE 1 % EX CREA: 1.0000 | TOPICAL_CREAM | Freq: Every day | CUTANEOUS | 0 refills | Status: DC

## 2023-10-16 MED ORDER — CEPHALEXIN 500 MG PO CAPS: 500.0000 mg | ORAL_CAPSULE | Freq: Three times a day (TID) | ORAL | 0 refills | Status: AC

## 2023-10-16 MED ORDER — SILVER SULFADIAZINE 1 % EX CREA
TOPICAL_CREAM | CUTANEOUS | Status: AC
  Filled 2023-10-16: qty 85
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
## 2023-10-16 NOTE — Discharge Instructions (Signed)
Take cephalexin 500 mg--1 capsule 3 times daily for 7 days   Wash the wound gently with soapy water once a day and then apply new Silvadene and a new clean bandage.  Please follow-up with your primary care and call the wound care clinic for further evaluation and treatment. Also please follow-up with your surgical specialist about this for

## 2023-10-30 ENCOUNTER — Other Ambulatory Visit: Payer: Self-pay | Admitting: Family Medicine

## 2023-10-30 DIAGNOSIS — I10 Essential (primary) hypertension: Secondary | ICD-10-CM

## 2023-12-09 ENCOUNTER — Ambulatory Visit: Payer: 59 | Admitting: Radiology

## 2024-01-19 LAB — HM MAMMOGRAPHY

## 2024-01-23 ENCOUNTER — Encounter: Payer: Self-pay | Admitting: Family Medicine

## 2024-06-15 ENCOUNTER — Other Ambulatory Visit: Payer: Self-pay | Admitting: Rheumatology

## 2024-06-15 DIAGNOSIS — R7989 Other specified abnormal findings of blood chemistry: Secondary | ICD-10-CM

## 2024-06-20 ENCOUNTER — Ambulatory Visit
Admission: RE | Admit: 2024-06-20 | Discharge: 2024-06-20 | Discharge status: Home / Self Care | Source: Ambulatory Visit | Attending: Rheumatology | Admitting: Rheumatology

## 2024-06-20 DIAGNOSIS — R7989 Other specified abnormal findings of blood chemistry: Secondary | ICD-10-CM

## 2024-07-16 ENCOUNTER — Encounter: Payer: Self-pay | Admitting: Radiology

## 2024-07-16 ENCOUNTER — Ambulatory Visit: Admitting: Radiology

## 2024-07-16 VITALS — BP 128/70 | HR 79 | Ht 64.0 in | Wt 149.0 lb

## 2024-07-16 DIAGNOSIS — N938 Other specified abnormal uterine and vaginal bleeding: Secondary | ICD-10-CM

## 2024-07-16 NOTE — Progress Notes (Signed)
      Subjective: Tracey Wood is a 58 y.o. female here with Spanish interpreter. C/o of a 6 day period last week after not having any bleeding x 18 months. She tried HRT x 6 months from 9/24-3/25 but stopped because she ran out. She did not have any bleeding after stopping HRT. Denies any pain.      Review of Systems  All other systems reviewed and are negative.   Past Medical History:  Diagnosis Date  . Bronchitis   . Cataract    had sx   . Hypertension   . Rheumatoid arthritis (HCC)   . Thyroid  disease   . UTI (urinary tract infection)       Objective:  Today's Vitals   07/16/24 1204  BP: 128/70  Pulse: 79  SpO2: 99%  Weight: 149 lb (67.6 kg)  Height: 5' 4 (1.626 m)   Body mass index is 25.58 kg/m.   Physical Exam Vitals and nursing note reviewed. Exam conducted with a chaperone present.  Constitutional:      Appearance: Normal appearance. She is well-developed.  Pulmonary:     Effort: Pulmonary effort is normal.  Abdominal:     General: Abdomen is flat.     Palpations: Abdomen is soft.  Genitourinary:    General: Normal vulva.     Vagina: No vaginal discharge, erythema, bleeding or lesions.     Cervix: Normal. No discharge, friability, lesion or erythema.     Uterus: Normal.      Adnexa: Right adnexa normal and left adnexa normal.  Neurological:     Mental Status: She is alert.  Psychiatric:        Mood and Affect: Mood normal.        Thought Content: Thought content normal.        Judgment: Judgment normal.     Heinz Senters, CMA present for exam  Assessment:/Plan:   1. DUB (dysfunctional uterine bleeding) (Primary) - [facility] - US  PELVIS TRANSVAGINAL NON-OB (TV ONLY); Future    Will contact patient with results of labs and ultrasound and manage accordingly  Return for u/s only, will call with results.   Ricco Dershem B, NP 12:19 PM

## 2024-07-17 ENCOUNTER — Ambulatory Visit: Payer: Self-pay | Admitting: Radiology

## 2024-07-17 DIAGNOSIS — N938 Other specified abnormal uterine and vaginal bleeding: Secondary | ICD-10-CM

## 2024-07-17 LAB — FOLLICLE STIMULATING HORMONE: FSH: 35.4 m[IU]/mL

## 2024-07-19 ENCOUNTER — Ambulatory Visit

## 2024-07-19 DIAGNOSIS — N938 Other specified abnormal uterine and vaginal bleeding: Secondary | ICD-10-CM

## 2024-08-04 ENCOUNTER — Other Ambulatory Visit: Payer: Self-pay | Admitting: Family Medicine

## 2024-08-04 DIAGNOSIS — I10 Essential (primary) hypertension: Secondary | ICD-10-CM

## 2024-08-14 ENCOUNTER — Ambulatory Visit (INDEPENDENT_AMBULATORY_CARE_PROVIDER_SITE_OTHER): Admitting: Radiology

## 2024-08-14 ENCOUNTER — Other Ambulatory Visit (HOSPITAL_COMMUNITY)
Admission: RE | Admit: 2024-08-14 | Discharge: 2024-08-14 | Disposition: A | Discharge status: Home / Self Care | Source: Ambulatory Visit | Attending: Radiology | Admitting: Radiology

## 2024-08-14 ENCOUNTER — Encounter: Payer: Self-pay | Admitting: Radiology

## 2024-08-14 VITALS — BP 130/74 | HR 81 | Wt 150.0 lb

## 2024-08-14 DIAGNOSIS — N950 Postmenopausal bleeding: Secondary | ICD-10-CM | POA: Insufficient documentation

## 2024-08-14 DIAGNOSIS — N938 Other specified abnormal uterine and vaginal bleeding: Secondary | ICD-10-CM

## 2024-08-14 NOTE — Progress Notes (Signed)
      ENDOMETRIAL BIOPSY       Tracey Wood 58 y.o. presents for endometrial biopsy. Accompanied by her husband who is also acting as Nurse, learning disability (form signed). Reason for biopsy: PMB on HRT.   Narrative & Impression  Indication: PMB   Vaginal ultrasound   Anteverted normal size and shape 8.55 x 4.61 x 4.06cm No myometrial masses     Thin symmetrical endometrium 4.03 mm No masses or thickening seen   Both ovaries normal    No adnexal masses   No free fluid   Impression: Normal gyn u/s      The indications for endometrial biopsy were reviewed.    Risks of the biopsy including cramping, bleeding, infection, uterine perforation, inadequate specimen and need for additional procedures  were discussed.  The patient states she understands and agrees to undergo procedure today. Consent obtained.  Time out was performed.   Procedure Speculum inserted into the vagina, cervix visualized and was prepped with Betadine. A single-toothed tenaculum was placed on the anterior lip of the cervix to stabilize it.  The 3 mm pipelle was introduced into the endometrial cavity without difficulty to a depth of 8 cm, suction initiated and a moderate amount of tissue was obtained and sent to pathology.  The instruments were removed from the patient's vagina.  Minimal bleeding from the cervix was noted.  The patient tolerated the procedure well.   Darice Hoit, CMA present for exam  Assessment/Plan: 1. Post-menopausal bleeding (Primary) - Surgical pathology( [place]/ [facility])    Routine post-procedure instructions were given to the patient.   Will contact with results of biopsy.    Avondre Richens, WHNP

## 2024-08-16 ENCOUNTER — Ambulatory Visit: Payer: Self-pay | Admitting: Radiology

## 2024-08-16 LAB — SURGICAL PATHOLOGY

## 2024-08-28 ENCOUNTER — Encounter: Payer: Self-pay | Admitting: Family Medicine

## 2024-08-28 ENCOUNTER — Ambulatory Visit: Admitting: Family Medicine

## 2024-08-28 VITALS — BP 110/70 | HR 100 | Resp 12 | Ht 64.0 in | Wt 154.0 lb

## 2024-08-28 DIAGNOSIS — E559 Vitamin D deficiency, unspecified: Secondary | ICD-10-CM | POA: Diagnosis not present

## 2024-08-28 DIAGNOSIS — E782 Mixed hyperlipidemia: Secondary | ICD-10-CM | POA: Diagnosis not present

## 2024-08-28 DIAGNOSIS — R7303 Prediabetes: Secondary | ICD-10-CM | POA: Diagnosis not present

## 2024-08-28 DIAGNOSIS — Z0000 Encounter for general adult medical examination without abnormal findings: Secondary | ICD-10-CM

## 2024-08-28 DIAGNOSIS — L821 Other seborrheic keratosis: Secondary | ICD-10-CM

## 2024-08-28 DIAGNOSIS — Z130 Encounter for screening for diseases of the blood and blood-forming organs and certain disorders involving the immune mechanism: Secondary | ICD-10-CM

## 2024-08-28 DIAGNOSIS — E538 Deficiency of other specified B group vitamins: Secondary | ICD-10-CM

## 2024-08-28 DIAGNOSIS — Z13228 Encounter for screening for other metabolic disorders: Secondary | ICD-10-CM

## 2024-08-28 DIAGNOSIS — Z1329 Encounter for screening for other suspected endocrine disorder: Secondary | ICD-10-CM

## 2024-08-28 DIAGNOSIS — E1165 Type 2 diabetes mellitus with hyperglycemia: Secondary | ICD-10-CM

## 2024-08-28 DIAGNOSIS — K760 Fatty (change of) liver, not elsewhere classified: Secondary | ICD-10-CM

## 2024-08-28 DIAGNOSIS — Z0001 Encounter for general adult medical examination with abnormal findings: Secondary | ICD-10-CM

## 2024-08-28 DIAGNOSIS — I10 Essential (primary) hypertension: Secondary | ICD-10-CM

## 2024-08-28 NOTE — Assessment & Plan Note (Signed)
 Right upper thigh. We discussed Dx, prognosis,and treatment options. Continue monitoring for changes.

## 2024-08-28 NOTE — Assessment & Plan Note (Signed)
 BP adequately controlled. Continue amlodipine -benazepril  5-20 mg daily and low-salt diet. Continue monitoring BP regularly. Eye exam is current. Continue annual follow up as far as problem is stable.

## 2024-08-28 NOTE — Assessment & Plan Note (Signed)
 Continue nonpharmacologic treatment. Further recommendations according to FLP result, coming back for labs tomorrow.

## 2024-08-28 NOTE — Patient Instructions (Addendum)
 A few things to remember from today's visit:  Routine history and physical examination of adult  Prediabetes - Plan: Hemoglobin A1c  Hyperlipidemia, mixed - Plan: Lipid panel  Vitamin D  deficiency, unspecified - Plan: Parathyroid  hormone, intact (no Ca), Phosphorus, VITAMIN D  25 Hydroxy (Vit-D Deficiency, Fractures)  Hepatic steatosis  B12 deficiency  Seborrheic keratosis  Hypertension, essential, benign - Plan: Basic metabolic panel with GFR  Screening for endocrine, metabolic and immunity disorder  If you need refills for medications you take chronically, please call your pharmacy. Do not use My Chart to request refills or for acute issues that need immediate attention. If you send a my chart message, it may take a few days to be addressed, specially if I am not in the office.  Please be sure medication list is accurate. If a new problem present, please set up appointment sooner than planned today.  Mantenimiento de Radiographer, therapeutic en las mujeres Health Maintenance, Female Adoptar un estilo de vida saludable y recibir atencin preventiva son importantes para promover la salud y Counsellor. Consulte al mdico sobre: El esquema adecuado para hacerse pruebas y exmenes peridicos. Cosas que puede hacer por su cuenta para prevenir enfermedades y Sun sano. Qu debo saber sobre la dieta, el peso y el ejercicio? Consuma una dieta saludable  Consuma una dieta que incluya muchas verduras, frutas, productos lcteos con bajo contenido de grasa y protenas magras. No consuma muchos alimentos ricos en grasas slidas, azcares agregados o sodio. Mantenga un peso saludable El ndice de masa muscular [facility]) se cocos (keeling) islands para identificar problemas de [place]. Proporciona una estimacin de la grasa corporal basndose en el peso y la altura. Su mdico puede ayudarle a determinar su IMC y a Personnel officer o Pharmacologist un peso saludable. Haga ejercicio con regularidad Haga ejercicio con regularidad. Esta es  una de las prcticas ms importantes que puede hacer por su salud. La [employer] de los adultos deben seguir estas pautas: Education officer, environmental, al menos, 150 minutos de actividad fsica por semana. El ejercicio debe aumentar la frecuencia cardaca y Media planner transpirar (ejercicio de intensidad moderada). Hacer ejercicios de fortalecimiento por lo [employer] por semana. Agregue esto a su plan de ejercicio de intensidad moderada. Pase menos tiempo sentada. Incluso la actividad fsica ligera puede ser beneficiosa. Controle sus niveles de colesterol y lpidos en la sangre Comience a realizarse anlisis de lpidos y Oncologist en la sangre a los 20 aos y luego reptalos cada 5 aos. Hgase controlar los niveles de colesterol con mayor frecuencia si: Sus niveles de lpidos y colesterol son altos. Es mayor de 40 aos. Presenta un alto riesgo de padecer enfermedades cardacas. Qu debo saber sobre las pruebas de deteccin del cncer? Segn su historia clnica y sus antecedentes familiares, es posible que deba realizarse pruebas de deteccin del cncer en diferentes edades. Esto puede incluir pruebas de deteccin de lo siguiente: Cncer de mama. Cncer de cuello uterino. Cncer colorrectal. Cncer de piel. Cncer de pulmn. Qu debo saber sobre la enfermedad cardaca, la diabetes y la hipertensin arterial? Presin arterial y enfermedad cardaca La hipertensin arterial causa enfermedades cardacas y lesotho el riesgo de accidente cerebrovascular. Es ms probable que esto se manifieste en las personas que tienen lecturas de presin arterial alta o tienen sobrepeso. Hgase controlar la presin arterial: Cada 3 a 5 aos si tiene entre 18 y 64 aos. Todos los aos si es mayor de 40 aos. Diabetes Realcese exmenes de deteccin de la diabetes con regularidad. Este anlisis revisa el nivel de  azcar en la sangre en ayunas. Hgase las pruebas de deteccin: Cada tres aos despus de los 40 aos de edad si tiene  un peso normal y un bajo riesgo de padecer diabetes. Con ms frecuencia y a partir de [place] edad inferior si tiene sobrepeso o un alto riesgo de padecer diabetes. Qu debo saber sobre la prevencin de infecciones? Hepatitis B Si tiene un riesgo ms alto de contraer hepatitis B, debe someterse a un examen de deteccin de este virus. Hable con el mdico para averiguar si tiene riesgo de contraer la infeccin por hepatitis B. Hepatitis C Se recomienda el anlisis a: [employer] 1945 y 1965. Todas las personas que tengan un riesgo de haber contrado hepatitis C. Enfermedades de transmisin sexual (ETS) Hgase las pruebas de Airline pilot de ITS, incluidas la gonorrea y la clamidia, si: Es sexualmente activa y es menor de [address]. Es mayor de [address], y Public affairs consultant informa que corre riesgo de tener este tipo de infecciones. La actividad sexual ha cambiado desde que le hicieron la ltima prueba de deteccin y tiene un riesgo mayor de tener clamidia o Copy. Pregntele al mdico si usted tiene riesgo. Pregntele al mdico si usted tiene un alto riesgo de Primary school teacher VIH. El mdico tambin puede recomendarle un medicamento recetado para ayudar a evitar la infeccin por el VIH. Si elige tomar medicamentos para prevenir el VIH, primero debe [employer] de deteccin del VIH. Luego debe hacerse anlisis cada 3 meses mientras est tomando los medicamentos. Embarazo Si est por dejar de menstruar (fase premenopusica) y usted puede quedar embarazada, busque asesoramiento antes de quedar embarazada. Tome de 400 a 800 microgramos (mcg) de cido flico todos los das si queda embarazada. Pida mtodos de control de la natalidad (anticonceptivos) si desea evitar un embarazo no deseado. Osteoporosis y rwanda La osteoporosis es una enfermedad en la que los huesos pierden los minerales y la fuerza por el avance de la edad. El resultado pueden ser fracturas en los [place]. Si tiene 65 aos o  ms, o si est en riesgo de sufrir osteoporosis y fracturas, pregunte a su mdico si debe: Hacerse pruebas de deteccin de prdida sea. Tomar un suplemento de calcio o de vitamina D para reducir el riesgo de fracturas. Recibir terapia de reemplazo hormonal (TRH) para tratar los sntomas de la menopausia. Siga estas indicaciones en su casa: Consumo de alcohol No beba alcohol si: Su mdico le indica no hacerlo. Est embarazada, puede estar embarazada o est tratando de quedar embarazada. Si bebe alcohol: Limite la cantidad que bebe a lo siguiente: De 0 a 1 bebida por da. Sepa cunta cantidad de alcohol hay en las bebidas que toma. En los [address], una medida equivale a una botella de cerveza de 12 oz (355 ml), un vaso de vino de 5 oz (148 ml) o un vaso de una bebida alcohlica de alta graduacin de 1 oz (44 ml). Estilo de vida No consuma ningn producto que contenga nicotina o tabaco. Estos productos incluyen cigarrillos, tabaco para Theatre manager y aparatos de vapeo, como los cigarrillos electrnicos. Si necesita ayuda para dejar de consumir estos productos, consulte al mdico. No consuma drogas. No comparta agujas. Solicite ayuda a su mdico si necesita apoyo o informacin para abandonar las drogas. Indicaciones generales Realcese los estudios de rutina de [address], dentales y de Wellsite geologist. Mantngase al da con las vacunas. Infrmele a su mdico si: Se siente deprimida con frecuencia. Alguna vez ha sido vctima  de maltrato o no se siente seguro en su casa. Resumen Adoptar un estilo de vida saludable y recibir atencin preventiva son importantes para promover la salud y Counsellor. Siga las instrucciones del mdico acerca de una dieta saludable, el ejercicio y la realizacin de pruebas o exmenes para Hotel manager. Siga las instrucciones del mdico con respecto al control del colesterol y la presin arterial. Esta informacin no tiene Theme park manager el consejo del  mdico. Asegrese de hacerle al mdico cualquier pregunta que tenga. Document Revised: 05/21/2021 Document Reviewed: 05/21/2021 Elsevier Patient Education  2024 [employer].

## 2024-08-28 NOTE — Assessment & Plan Note (Signed)
 Mildly elevated transaminases at her rheumatologist's office. ALT 55 and ALT 42. RUQ US  on 06/20/24. Avoid alcohol intake and decreased refined carbs intake. Planning on repeating labs in 11/2024.

## 2024-08-28 NOTE — Assessment & Plan Note (Signed)
 Currently she is not on vit D supplementation. Further recommendations according to 25 [place] vit D result.

## 2024-08-28 NOTE — Progress Notes (Signed)
 "   Chief Complaint  Patient presents with   Annual Exam   Discussed the use of AI scribe software for clinical note transcription with the patient, who gave verbal consent to proceed.  History of Present Illness Tracey Wood is a 58 year old female with PMHx significant for HTN,RA,GERD,and aortic atherosclerosis who presents today with her husband for a follow-up visit  and CPE. Last CPE: 09/13/22  Since her last visit, 09/09/23, she underwent left shoulder rotator cuff surgery.  She follows a diet with reduced flour intake since experiencing significant inflammation last year. She does not engage in regular exercise but performs shoulder exercises and occasionally walks. Eye and dental exams once per year, when she goes to [place].  She quit smoking in 2010 and consumes alcohol infrequently, typically a citrus-flavored drink during hot weather.  Health Maintenance  Topic Date Due   COVID-19 Vaccine (3 - Pfizer risk series) 09/13/2024*   Zoster (Shingles) Vaccine (1 of 2) 11/27/2024*   Flu Shot  03/26/2025*   DTaP/Tdap/Td vaccine (2 - Td or Tdap) 08/28/2025*   Pneumococcal Vaccine for age over 105 (1 of 1 - PCV) 08/28/2025*   Hepatitis B Vaccine (1 of 3 - 19+ 3-dose series) 08/28/2025*   Pap with HPV screening  09/06/2025   Mammogram  01/18/2026   Colon Cancer Screening  04/04/2030   Hepatitis C Screening  Completed   HIV Screening  Completed   HPV Vaccine  Aged Out   Meningitis B Vaccine  Aged Out  *Topic was postponed. The date shown is not the original due date.   Immunization History  Administered Date(s) Administered   Influenza,inj,Quad PF,6+ Mos 09/19/2019   Influenza-Unspecified 12/15/2010, 11/16/2016   PFIZER(Purple Top)SARS-COV-2 Vaccination 02/28/2020, 03/20/2020   PPD Test 10/22/2009   Tdap 12/27/2005   -She underwent left shoulder rotator cuff surgery 10/06/24. After waking up from anesthesia, she noted erythema on left arm , next day and blister.  Evaluated in the ED 10/15/24, describes problem as a 'mechanical burn. She continues to have issues with her right shoulder, which she attributes to calcium deposits in the joints. An MRI has been advised by her ortho for further evaluation.  -She has a history of recurrent urinary tract infections, for which she was on antibiotics for six months until January. She was prescribed methenamine and vitamin C for prevention and a topical estrogen cream to prevent infections.Follows with urologist.  She was seen by her gynecologist recently due to DUB after last menstrual period, 18 months ago. She underwent endometrial biopsy.  She has a follow-up appointment for a Pap smear scheduled for September 18/25.  She reports a history of low vitamin D  levels, previously treated with high-dose vitamin D , but she is not currently taking any vitamin D  supplements. She was informed in [place] that excess vitamin D  without vitamin K can lead to calcium deposits in the joints.  Lab Results  Component Value Date   VD25OH 31.76 09/09/2023   She also had B12 and PTH checked in [place]. 01/11/24: B12 358. 25 OH vit D 16.6  -She is currently on Orencia for rheumatoid arthritis, having resumed it after a 81-month hiatus. She sees her rheumatologist every six months. Recent LFT with elevated transaminases, 06/14/24: AST 42 and ALT 55. Plannin gon having labs repeated in 11/2024.  -She reports episodes of nasal congestion and headaches since March, which she attributes to allergies. The symptoms are intermittent and improve with nasal saline irrigation.  -She is concerned about  hyperpigmented skin lesion upper right thigh, noted years ago.Has seen dermatologist for this problem, she was reassured.  HLD on non pharmacologic treatment.  HTN: She is on Amlodipine -Benazepril  5-20 mg daily. She does not check her BP at home. Lab Results  Component Value Date   NA 138 09/09/2023   CL 102 09/09/2023   K 3.4 (L)  09/09/2023   CO2 28 09/09/2023   BUN 14 09/09/2023   CREATININE 0.72 09/09/2023   GFR 94.42 09/09/2023   CALCIUM 9.4 09/09/2023   PHOS 2.9 08/30/2022   ALBUMIN 3.9 09/09/2023   GLUCOSE 98 09/09/2023   Review of Systems  Constitutional:  Negative for activity change, appetite change and fever.  HENT:  Negative for mouth sores, sore throat and trouble swallowing.   Eyes:  Negative for redness and visual disturbance.  Respiratory:  Negative for cough, shortness of breath and wheezing.   Cardiovascular:  Negative for chest pain and leg swelling.  Gastrointestinal:  Negative for abdominal pain, nausea and vomiting.  Endocrine: Negative for cold intolerance, heat intolerance, polydipsia, polyphagia and polyuria.  Genitourinary:  Negative for decreased urine volume, dysuria, hematuria, vaginal bleeding and vaginal discharge.  Musculoskeletal:  Positive for arthralgias. Negative for gait problem and myalgias.  Skin:  Negative for color change and rash.  Allergic/Immunologic: Positive for environmental allergies.  Neurological:  Negative for syncope, weakness and headaches.  Hematological:  Negative for adenopathy. Does not bruise/bleed easily.  Psychiatric/Behavioral:  Negative for confusion. The patient is not nervous/anxious.   All other systems reviewed and are negative.   Current Outpatient Medications on File Prior to Visit  Medication Sig Dispense Refill   acetaminophen  (TYLENOL ) 500 MG tablet Take 1,000 mg by mouth daily as needed for moderate pain.     amLODipine -benazepril  (LOTREL) 5-20 MG capsule Take 1 capsule by mouth daily. Due for follow up/physical 90 capsule 0   estradiol  (VIVELLE -DOT) 0.025 MG/24HR PLACE 1 PATCH ONTO THE SKIN 2 TIMES PER WEEK 8 patch 2   ibuprofen  (ADVIL ) 800 MG tablet Take 1 tablet 3 times a day by oral route.     MAGNESIUM  PO Take by mouth.     ORENCIA CLICKJECT 125 MG/ML SOAJ 125mg  Subcutaneous once a week for 90 days     progesterone  (PROMETRIUM ) 100  MG capsule Take 1 capsule (100 mg total) by mouth daily. 90 capsule 0   Vitamin D , Ergocalciferol , (DRISDOL ) 1.25 MG (50000 UNIT) CAPS capsule TAKE 1 CAPSULE BY MOUTH every 10 days. 12 capsule 1   No current facility-administered medications on file prior to visit.    Past Medical History:  Diagnosis Date   Bronchitis    Cataract    had sx    Hypertension    Rheumatoid arthritis (HCC)    Thyroid  disease    UTI (urinary tract infection)     Past Surgical History:  Procedure Laterality Date   CATARACT EXTRACTION     R & L   TUBAL LIGATION      Allergies  Allergen Reactions   Morphine  And Codeine Other (See Comments)    Fast heart beat   Methotrexate Other (See Comments)   Baricitinib Rash   Ciprofloxacin Rash   Oxycodone  Palpitations   Tape Dermatitis    Family History  Problem Relation Age of Onset   Breast cancer Maternal Grandmother 67    Social History   Socioeconomic History   Marital status: Married    Spouse name: Not on file   Number of children: Not on  file   Years of education: Not on file   Highest education level: Not on file  Occupational History   Not on file  Tobacco Use   Smoking status: Former    Current packs/day: 0.00    Average packs/day: 0.8 packs/day for 25.0 years (18.8 ttl pk-yrs)    Types: Cigarettes    Start date: 83    Quit date: 2010    Years since quitting: 15.6   Smokeless tobacco: Never  Vaping Use   Vaping status: Never Used  Substance and Sexual Activity   Alcohol use: No    Alcohol/week: 0.0 standard drinks of alcohol   Drug use: No   Sexual activity: Yes    Partners: Male    Birth control/protection: Surgical    Comment: 1st intercourse- 20, partners- 2, btl  Other Topics Concern   Not on file  Social History Narrative   Right handed   One story   No caffeine x 2 weeks   Social Drivers of Corporate Investment Banker Strain: Not on file  Food Insecurity: No Food Insecurity (08/31/2022)   Hunger Vital Sign     Worried About Running Out of Food in the Last Year: Never true    Ran Out of Food in the Last Year: Never true  Transportation Needs: No Transportation Needs (08/31/2022)   PRAPARE - Administrator, Civil Service (Medical): No    Lack of Transportation (Non-Medical): No  Physical Activity: Not on file  Stress: Not on file  Social Connections: Unknown (05/07/2022)   Received from [facility]   Social Network    Social Network: Not on file    Vitals:   08/28/24 1550  BP: 110/70  Pulse: 100  Resp: 12  SpO2: 98%   Body mass index is 26.43 kg/m.  Wt Readings from Last 3 Encounters:  08/28/24 154 lb (69.9 kg)  08/14/24 150 lb (68 kg)  07/16/24 149 lb (67.6 kg)   Physical Exam Vitals and nursing note reviewed.  Constitutional:      General: She is not in acute distress.    Appearance: She is well-developed and well-groomed.  HENT:     Head: Normocephalic and atraumatic.     Right Ear: Tympanic membrane, ear canal and external ear normal.     Left Ear: Tympanic membrane, ear canal and external ear normal.     Nose:     Right Sinus: No maxillary sinus tenderness or frontal sinus tenderness.     Left Sinus: No maxillary sinus tenderness or frontal sinus tenderness.     Mouth/Throat:     Mouth: Mucous membranes are moist.     Pharynx: Oropharynx is clear. Uvula midline.  Eyes:     Extraocular Movements: Extraocular movements intact.     Conjunctiva/sclera: Conjunctivae normal.     Pupils: Pupils are equal, round, and reactive to light.  Neck:     Thyroid : No thyroid  mass or thyromegaly.  Cardiovascular:     Rate and Rhythm: Normal rate and regular rhythm.     Pulses:          Dorsalis pedis pulses are 2+ on the right side and 2+ on the left side.     Heart sounds: No murmur heard. Pulmonary:     Effort: Pulmonary effort is normal. No respiratory distress.     Breath sounds: Normal breath sounds.  Abdominal:     Palpations: Abdomen is soft. There is no  hepatomegaly or mass.  Tenderness: There is no abdominal tenderness.  Genitourinary:    Comments: Deferred to gyn. Musculoskeletal:     Comments: No signs of synovitis appreciated.  Lymphadenopathy:     Cervical: No cervical adenopathy.     Upper Body:     Right upper body: No supraclavicular adenopathy.     Left upper body: No supraclavicular adenopathy.  Skin:    General: Skin is warm.     Findings: Lesion present. No erythema or rash.      Neurological:     General: No focal deficit present.     Mental Status: She is alert and oriented to person, place, and time.     Cranial Nerves: No cranial nerve deficit.     Coordination: Coordination normal.     Gait: Gait normal.     Deep Tendon Reflexes:     Reflex Scores:      Bicep reflexes are 2+ on the right side and 2+ on the left side.      Patellar reflexes are 2+ on the right side and 2+ on the left side. Psychiatric:        Mood and Affect: Mood and affect normal.   ASSESSMENT AND PLAN:  Tracey Wood was here today annual physical examination and follow up  Orders Placed This Encounter  Procedures   Parathyroid  hormone, intact (no Ca)   Phosphorus   Basic metabolic panel with GFR   Hemoglobin A1c   Lipid panel   VITAMIN D  25 Hydroxy (Vit-D Deficiency, Fractures)   Lab Results  Component Value Date   NA 137 08/29/2024   CL 100 08/29/2024   K 4.0 08/29/2024   CO2 27 08/29/2024   BUN 11 08/29/2024   CREATININE 0.65 08/29/2024   GFR 98.75 08/29/2024   CALCIUM 9.4 08/29/2024   PHOS 3.5 08/29/2024   ALBUMIN 3.9 09/09/2023   GLUCOSE 128 (H) 08/29/2024   Lab Results  Component Value Date   HGBA1C 7.1 (H) 08/29/2024   Lab Results  Component Value Date   VD25OH 15.93 (L) 08/29/2024   Lab Results  Component Value Date   PTH 42 06/07/2023   CALCIUM 9.4 08/29/2024   PHOS 3.5 08/29/2024   Lab Results  Component Value Date   CHOL 206 (H) 08/29/2024   HDL 44.90 08/29/2024   LDLCALC 102 (H)  08/29/2024   LDLDIRECT 174.0 04/06/2021   TRIG 294.0 (H) 08/29/2024   CHOLHDL 5 08/29/2024   Routine history and physical examination of adult Assessment & Plan: We discussed the importance of regular physical activity and healthy diet for prevention of chronic illness and/or complications. Preventive guidelines reviewed. Vaccination: Declined. Ca++ and vit D supplementation recommended. Continue her female preventive care with her gynecologist. Next CPE in a year.   Prediabetes Assessment & Plan: Last HgA1C 6.1. Encouraged consistency with a healthy life style for diabetes prevention.  Orders: -     Hemoglobin A1c; Future  Hyperlipidemia, mixed Assessment & Plan: Continue nonpharmacologic treatment. Further recommendations according to FLP result, coming back for labs tomorrow.  Orders: -     Lipid panel; Future  Vitamin D  deficiency, unspecified Assessment & Plan: Currently she is not on vit D supplementation. Further recommendations according to 25 OH vit D result.  Orders: -     Parathyroid  hormone, intact (no Ca); Future -     Phosphorus; Future -     VITAMIN D  25 Hydroxy (Vit-D Deficiency, Fractures); Future  Hepatic steatosis Assessment & Plan: Mildly elevated transaminases  at her rheumatologist's office. ALT 55 and ALT 42. RUQ US  on 06/20/24. Avoid alcohol intake and decreased refined carbs intake. Planning on repeating labs in 11/2024.    B12 deficiency Assessment & Plan: 12/2023 B12 358. Recommend B12 2000 mcg once per week.   Seborrheic keratosis Assessment & Plan: Right upper thigh. We discussed Dx, prognosis,and treatment options. Continue monitoring for changes.   Hypertension, essential, benign Assessment & Plan: BP adequately controlled. Continue amlodipine -benazepril  5-20 mg daily and low-salt diet. Continue monitoring BP regularly. Eye exam is current. Continue annual follow up as far as problem is stable.  Orders: -     Basic  metabolic panel with GFR; Future  Screening for endocrine, metabolic and immunity disorder -     Hemoglobin A1c; Future  In regard to intermittent episodes of sinus pressure/headaches, currently asymptomatic, no sinus tenderness with palpation. She will let me know when she develops symptoms again, we may need to consider oral antibiotic.  Return in 1 year (on 08/28/2025) for CPE, chronic problems, Labs.  Riccardo Holeman G. Miamor Ayler, MD  [facility]. [place] office. "

## 2024-08-28 NOTE — Assessment & Plan Note (Signed)
 We discussed the importance of regular physical activity and healthy diet for prevention of chronic illness and/or complications. Preventive guidelines reviewed. Vaccination: Declined. Ca++ and vit D supplementation recommended. Continue her female preventive care with her gynecologist. Next CPE in a year.

## 2024-08-28 NOTE — Assessment & Plan Note (Signed)
 Last HgA1C 6.1. Encouraged consistency with a healthy life style for diabetes prevention.

## 2024-08-28 NOTE — Assessment & Plan Note (Signed)
 12/2023 B12 358. Recommend B12 2000 mcg once per week.

## 2024-08-29 ENCOUNTER — Other Ambulatory Visit (INDEPENDENT_AMBULATORY_CARE_PROVIDER_SITE_OTHER)

## 2024-08-29 DIAGNOSIS — E559 Vitamin D deficiency, unspecified: Secondary | ICD-10-CM | POA: Diagnosis not present

## 2024-08-29 DIAGNOSIS — Z13228 Encounter for screening for other metabolic disorders: Secondary | ICD-10-CM | POA: Diagnosis not present

## 2024-08-29 DIAGNOSIS — Z130 Encounter for screening for diseases of the blood and blood-forming organs and certain disorders involving the immune mechanism: Secondary | ICD-10-CM | POA: Diagnosis not present

## 2024-08-29 DIAGNOSIS — E782 Mixed hyperlipidemia: Secondary | ICD-10-CM | POA: Diagnosis not present

## 2024-08-29 DIAGNOSIS — Z1329 Encounter for screening for other suspected endocrine disorder: Secondary | ICD-10-CM

## 2024-08-29 DIAGNOSIS — R7303 Prediabetes: Secondary | ICD-10-CM

## 2024-08-29 DIAGNOSIS — I10 Essential (primary) hypertension: Secondary | ICD-10-CM

## 2024-08-29 LAB — LIPID PANEL
Cholesterol: 206 mg/dL — ABNORMAL HIGH (ref 0–200)
HDL: 44.9 mg/dL (ref >39.00)
LDL Cholesterol: 102 mg/dL — ABNORMAL HIGH (ref 0–99)
NonHDL: 161.21
Total CHOL/HDL Ratio: 5
Triglycerides: 294 mg/dL — ABNORMAL HIGH (ref 0.0–149.0)
VLDL: 58.8 mg/dL — ABNORMAL HIGH (ref 0.0–40.0)

## 2024-08-29 LAB — VITAMIN D 25 HYDROXY (VIT D DEFICIENCY, FRACTURES): VITD: 15.93 ng/mL — ABNORMAL LOW (ref 30.00–100.00)

## 2024-08-29 LAB — BASIC METABOLIC PANEL WITH GFR
BUN: 11 mg/dL (ref 6–23)
CO2: 27 meq/L (ref 19–32)
Calcium: 9.4 mg/dL (ref 8.4–10.5)
Chloride: 100 meq/L (ref 96–112)
Creatinine, Ser: 0.65 mg/dL (ref 0.40–1.20)
GFR: 98.75 mL/min (ref >60.00)
Glucose, Bld: 128 mg/dL — ABNORMAL HIGH (ref 70–99)
Potassium: 4 meq/L (ref 3.5–5.1)
Sodium: 137 meq/L (ref 135–145)

## 2024-08-29 LAB — HEMOGLOBIN A1C: Hgb A1c MFr Bld: 7.1 % — ABNORMAL HIGH (ref 4.6–6.5)

## 2024-08-29 LAB — PHOSPHORUS: Phosphorus: 3.5 mg/dL (ref 2.3–4.6)

## 2024-08-30 ENCOUNTER — Ambulatory Visit: Payer: Self-pay | Admitting: Family Medicine

## 2024-08-30 DIAGNOSIS — E1165 Type 2 diabetes mellitus with hyperglycemia: Secondary | ICD-10-CM

## 2024-08-30 LAB — PARATHYROID HORMONE, INTACT (NO CA): PTH: 25 pg/mL (ref 16–77)

## 2024-08-30 MED ORDER — VITAMIN D (ERGOCALCIFEROL) 1.25 MG (50000 UNIT) PO CAPS: ORAL_CAPSULE | ORAL | 1 refills | Status: AC

## 2024-09-13 ENCOUNTER — Encounter: Payer: Self-pay | Admitting: Radiology

## 2024-09-13 ENCOUNTER — Ambulatory Visit (INDEPENDENT_AMBULATORY_CARE_PROVIDER_SITE_OTHER): Payer: 59 | Admitting: Radiology

## 2024-09-13 VITALS — BP 124/76 | HR 74 | Ht 63.0 in | Wt 148.0 lb

## 2024-09-13 DIAGNOSIS — Z01419 Encounter for gynecological examination (general) (routine) without abnormal findings: Secondary | ICD-10-CM | POA: Diagnosis not present

## 2024-09-13 DIAGNOSIS — Z1331 Encounter for screening for depression: Secondary | ICD-10-CM | POA: Diagnosis not present

## 2024-09-13 NOTE — Progress Notes (Signed)
   Tracey Wood 08-Jul-1966 [id number]   History: Postmenopausal 58 y.o. presents for annual exam. No gyn concerns. Here with Spanish interpreter.   Gynecologic History Postmenopausal Last Pap: 2023. Results were: normal Last mammogram: 1/25. Results were: normal Last colonoscopy: 2021   Obstetric History OB History  Gravida Para Term Preterm AB Living  3 3 2 1  2   SAB IAB Ectopic Multiple Live Births  0 0 0 0 2    # Outcome Date GA Lbr Len/2nd Weight Sex Type Anes PTL Lv  3 Term           2 Term           1 Preterm         ND       09/13/2024    3:30 PM 09/09/2023   10:18 AM 06/07/2023    8:58 AM  Depression screen PHQ 2/9  Decreased Interest 0 0 0  Down, Depressed, Hopeless 0 0 0  PHQ - 2 Score 0 0 0     The following portions of the patient's history were reviewed and updated as appropriate: allergies, current medications, past family history, past medical history, past social history, past surgical history, and problem list.  Review of Systems Pertinent items noted in HPI and remainder of comprehensive ROS otherwise negative.  Past medical history, past surgical history, family history and social history were all reviewed and documented in the EPIC chart.  Exam:  Vitals:   09/13/24 1526  BP: 124/76  Pulse: 74  SpO2: 97%  Weight: 148 lb (67.1 kg)  Height: 5' 3 (1.6 m)   Body mass index is 26.22 kg/m.  General appearance:  Normal Thyroid :  Symmetrical, normal in size, without palpable masses or nodularity. Respiratory  Auscultation:  Clear without wheezing or rhonchi Cardiovascular  Auscultation:  Regular rate, without rubs, murmurs or gallops  Edema/varicosities:  Not grossly evident Abdominal  Soft,nontender, without masses, guarding or rebound.  Liver/spleen:  No organomegaly noted  Hernia:  None appreciated  Skin  Inspection:  Grossly normal Breasts: Examined lying and sitting.   Right: Without masses, retractions, nipple discharge or  axillary adenopathy.   Left: Without masses, retractions, nipple discharge or axillary adenopathy. Genitourinary   Inguinal/mons:  Normal without inguinal adenopathy  External genitalia:  Normal appearing vulva with no masses, tenderness, or lesions  BUS/Urethra/Skene's glands:  Normal  Vagina:  Normal appearing with normal color and discharge, no lesions. Atrophy: mild   Cervix:  Normal appearing without discharge or lesions  Uterus:  Normal in size, shape and contour.  Midline and mobile, nontender  Adnexa/parametria:     Rt: Normal in size, without masses or tenderness.   Lt: Normal in size, without masses or tenderness.  Anus and perineum: Normal    Darice Hoit, CMA present for exam  Assessment/Plan:   1. Well woman exam with routine gynecological exam (Primary) Pap 2026 Labs with PCP  2. Depression screening    Discussed SBE, colonoscopy and DEXA screening as directed. Recommend of exercise weekly, including weight bearing exercise. Encouraged the use of seatbelts and sunscreen.  Return in 1 year for annual or sooner prn.  Armetta Henri B WHNP-BC, 3:58 PM 09/13/2024

## 2024-09-13 NOTE — Patient Instructions (Signed)
 Preventive Care 56-56 Years Old, Female  Preventive care refers to lifestyle choices and visits with your health care provider that can promote health and wellness. Preventive care visits are also called wellness exams.  What can I expect for my preventive care visit?  Counseling  Your health care provider may ask you questions about your:  Medical history, including:  Past medical problems.  Family medical history.  Pregnancy history.  Current health, including:  Menstrual cycle.  Method of birth control.  Emotional well-being.  Home life and relationship well-being.  Sexual activity and sexual health.  Lifestyle, including:  Alcohol, nicotine or tobacco, and drug use.  Access to firearms.  Diet, exercise, and sleep habits.  Work and work Astronomer.  Sunscreen use.  Safety issues such as seatbelt and bike helmet use.  Physical exam  Your health care provider will check your:  Height and weight. These may be used to calculate your BMI (body mass index). BMI is a measurement that tells if you are at a healthy weight.  Waist circumference. This measures the distance around your waistline. This measurement also tells if you are at a healthy weight and may help predict your risk of certain diseases, such as type 2 diabetes and high blood pressure.  Heart rate and blood pressure.  Body temperature.  Skin for abnormal spots.  What immunizations do I need?    Vaccines are usually given at various ages, according to a schedule. Your health care provider will recommend vaccines for you based on your age, medical history, and lifestyle or other factors, such as travel or where you work.  What tests do I need?  Screening  Your health care provider may recommend screening tests for certain conditions. This may include:  Lipid and cholesterol levels.  Diabetes screening. This is done by checking your blood sugar (glucose) after you have not eaten for a while (fasting).  Pelvic exam and Pap test.  Hepatitis B test.  Hepatitis C  test.  HIV (human immunodeficiency virus) test.  STI (sexually transmitted infection) testing, if you are at risk.  Lung cancer screening.  Colorectal cancer screening.  Mammogram. Talk with your health care provider about when you should start having regular mammograms. This may depend on whether you have a family history of breast cancer.  BRCA-related cancer screening. This may be done if you have a family history of breast, ovarian, tubal, or peritoneal cancers.  Bone density scan. This is done to screen for osteoporosis.  Talk with your health care provider about your test results, treatment options, and if necessary, the need for more tests.  Follow these instructions at home:  Eating and drinking    Eat a diet that includes fresh fruits and vegetables, whole grains, lean protein, and low-fat dairy products.  Take vitamin and mineral supplements as recommended by your health care provider.  Do not drink alcohol if:  Your health care provider tells you not to drink.  You are pregnant, may be pregnant, or are planning to become pregnant.  If you drink alcohol:  Limit how much you have to 0-1 drink a day.  Know how much alcohol is in your drink. In the [place] one drink equals one 12 oz bottle of beer (355 mL), one 5 oz glass of wine (148 mL), or one 1 oz glass of hard liquor (44 mL).  Lifestyle  Brush your teeth every morning and night with fluoride toothpaste. Floss one time each day.  Exercise for at least  30 minutes 5 or more days each week.  Do not use any products that contain nicotine or tobacco. These products include cigarettes, chewing tobacco, and vaping devices, such as e-cigarettes. If you need help quitting, ask your health care provider.  Do not use drugs.  If you are sexually active, practice safe sex. Use a condom or other form of protection to prevent STIs.  If you do not wish to become pregnant, use a form of birth control. If you plan to become pregnant, see your health care provider for a  prepregnancy visit.  Take aspirin only as told by your health care provider. Make sure that you understand how much to take and what form to take. Work with your health care provider to find out whether it is safe and beneficial for you to take aspirin daily.  Find healthy ways to manage stress, such as:  Meditation, yoga, or listening to music.  Journaling.  Talking to a trusted person.  Spending time with friends and family.  Minimize exposure to UV radiation to reduce your risk of skin cancer.  Safety  Always wear your seat belt while driving or riding in a vehicle.  Do not drive:  If you have been drinking alcohol. Do not ride with someone who has been drinking.  When you are tired or distracted.  While texting.  If you have been using any mind-altering substances or drugs.  Wear a helmet and other protective equipment during sports activities.  If you have firearms in your house, make sure you follow all gun safety procedures.  Seek help if you have been physically or sexually abused.  What's next?  Visit your health care provider once a year for an annual wellness visit.  Ask your health care provider how often you should have your eyes and teeth checked.  Stay up to date on all vaccines.  This information is not intended to replace advice given to you by your health care provider. Make sure you discuss any questions you have with your health care provider.  Document Revised: 06/10/2021 Document Reviewed: 06/10/2021  Elsevier Patient Education  2024 [employer].

## 2024-10-03 ENCOUNTER — Encounter: Payer: Self-pay | Admitting: Skilled Nursing Facility1

## 2024-10-03 ENCOUNTER — Ambulatory Visit: Payer: Self-pay

## 2024-10-03 ENCOUNTER — Encounter: Attending: Family Medicine | Admitting: Skilled Nursing Facility1

## 2024-10-03 VITALS — Ht 62.0 in | Wt 144.6 lb

## 2024-10-03 DIAGNOSIS — E119 Type 2 diabetes mellitus without complications: Secondary | ICD-10-CM | POA: Insufficient documentation

## 2024-10-03 NOTE — Progress Notes (Unsigned)
 Appt start time 9:45 end time 11:15 Alm interpretor with [facility]  A1c 7.1  Cholesterol 206 Triglycerides 294 VLDL 58.8 LDL 102  Pt staets she has changed some food habits: reduced sugar in coffee, no rice-pasta-bread  Other DX: RA

## 2024-10-03 NOTE — Telephone Encounter (Signed)
 FYI Only or Action Required?: FYI only for provider.  Patient was last seen in primary care on 08/28/2024 by [place], Betty G, MD.  Called Nurse Triage reporting Chest Pain.  Symptoms began several days ago.  Interventions attempted: Nothing.  Symptoms are: gradually worsening.  Triage Disposition: Go to ED Now (or PCP Triage)  Patient/caregiver understands and will follow disposition?: Yes   RN advised ER. Pt agreed. She still asked for an appt, advised appt could be set up for follow up after ER visit.   Copied from CRM [id number]. Topic: Clinical - Red Word Triage >> Oct 03, 2024  1:55 PM Taleah C wrote: Red Word that prompted transfer to Nurse Triage: heart pain and under ribs on right side, classified pain a 8. Sxs for 3 days. Reason for Disposition  Taking a deep breath makes pain worse  Answer Assessment - Initial Assessment Questions Pacific interpreter [phone].   Mild discomfort a couple of weeks ago. Since Sunday constant pain on right side under ribs with shortness of breath when pain is intense.    1. LOCATION: Where does it hurt?       Right side: below the ribs 2. RADIATION: Does the pain go anywhere else? (e.g., into neck, jaw, arms, back)     no 3. ONSET: When did the chest pain begin? (Minutes, hours or days)       4. PATTERN: Does the pain come and go, or has it been constant since it started?  Does it get worse with exertion?      constant 5. DURATION: How long does it last (e.g., seconds, minutes, hours)     constant 6. SEVERITY: How bad is the pain?  (e.g., Scale 1-10; mild, moderate, or severe)     8/10 7. CARDIAC RISK FACTORS: Do you have any history of heart problems or risk factors for heart disease? (e.g., angina, prior heart attack; diabetes, high blood pressure, high cholesterol, smoker, or strong family history of heart disease)     High blood pressure 8. PULMONARY RISK FACTORS: Do you have any history of lung disease?  (e.g., blood  clots in lung, asthma, emphysema, birth control pills)     No  9. CAUSE: What do you think is causing the chest pain?    unsure 10. OTHER SYMPTOMS: Do you have any other symptoms? (e.g., dizziness, nausea, vomiting, sweating, fever, difficulty breathing, cough)       When the pain is bad can't breath  Protocols used: Chest Pain-A-AH

## 2024-10-12 ENCOUNTER — Ambulatory Visit: Admitting: Family Medicine

## 2024-10-12 ENCOUNTER — Ambulatory Visit

## 2024-10-12 ENCOUNTER — Encounter: Payer: Self-pay | Admitting: Family Medicine

## 2024-10-12 VITALS — BP 122/80 | HR 72 | Temp 97.9°F | Resp 16 | Ht 63.0 in | Wt 146.8 lb

## 2024-10-12 DIAGNOSIS — R7401 Elevation of levels of liver transaminase levels: Secondary | ICD-10-CM | POA: Diagnosis not present

## 2024-10-12 DIAGNOSIS — R1011 Right upper quadrant pain: Secondary | ICD-10-CM

## 2024-10-12 DIAGNOSIS — I10 Essential (primary) hypertension: Secondary | ICD-10-CM | POA: Diagnosis not present

## 2024-10-12 LAB — CBC WITH DIFFERENTIAL/PLATELET
Basophils Absolute: 0 K/uL (ref 0.0–0.1)
Basophils Relative: 0.6 % (ref 0.0–3.0)
Eosinophils Absolute: 0.2 K/uL (ref 0.0–0.7)
Eosinophils Relative: 2.4 % (ref 0.0–5.0)
HCT: 39.3 % (ref 36.0–46.0)
Hemoglobin: 12.9 g/dL (ref 12.0–15.0)
Lymphocytes Relative: 46.4 % — ABNORMAL HIGH (ref 12.0–46.0)
Lymphs Abs: 3.1 K/uL (ref 0.7–4.0)
MCHC: 32.8 g/dL (ref 30.0–36.0)
MCV: 87.1 fl (ref 78.0–100.0)
Monocytes Absolute: 0.5 K/uL (ref 0.1–1.0)
Monocytes Relative: 7.6 % (ref 3.0–12.0)
Neutro Abs: 2.9 K/uL (ref 1.4–7.7)
Neutrophils Relative %: 43 % (ref 43.0–77.0)
Platelets: 315 K/uL (ref 150.0–400.0)
RBC: 4.51 Mil/uL (ref 3.87–5.11)
RDW: 13.6 % (ref 11.5–15.5)
WBC: 6.7 K/uL (ref 4.0–10.5)

## 2024-10-12 LAB — BASIC METABOLIC PANEL WITH GFR
BUN: 12 mg/dL (ref 6–23)
CO2: 27 meq/L (ref 19–32)
Calcium: 9.3 mg/dL (ref 8.4–10.5)
Chloride: 104 meq/L (ref 96–112)
Creatinine, Ser: 0.73 mg/dL (ref 0.40–1.20)
GFR: 92.16 mL/min (ref >60.00)
Glucose, Bld: 93 mg/dL (ref 70–99)
Potassium: 4 meq/L (ref 3.5–5.1)
Sodium: 139 meq/L (ref 135–145)

## 2024-10-12 LAB — HEPATIC FUNCTION PANEL
ALT: 22 U/L (ref 0–35)
AST: 17 U/L (ref 0–37)
Albumin: 4.3 g/dL (ref 3.5–5.2)
Alkaline Phosphatase: 84 U/L (ref 39–117)
Bilirubin, Direct: 0.2 mg/dL (ref 0.0–0.3)
Total Bilirubin: 0.6 mg/dL (ref 0.2–1.2)
Total Protein: 7.6 g/dL (ref 6.0–8.3)

## 2024-10-12 MED ORDER — AMLODIPINE BESY-BENAZEPRIL HCL 5-20 MG PO CAPS: 1.0000 | ORAL_CAPSULE | Freq: Every day | ORAL | 2 refills | Status: DC

## 2024-10-12 NOTE — Assessment & Plan Note (Signed)
 BP adequately controlled. Continue amlodipine -benazepril  5-20 mg daily and low-salt diet and low salt diet. Monitor BP regularly. Eye exam is current. Planning on moving to [facility], so she will be establishing with new PCP.

## 2024-10-12 NOTE — Progress Notes (Unsigned)
 ACUTE VISIT Chief Complaint  Patient presents with   Acute Visit    Right side pain below rib cage off and on for two weeks    Discussed the use of AI scribe software for clinical note transcription with the patient, who gave verbal consent to proceed. History of Present Illness Shelsy Seng is a 58 year old female with PMHx significant for RA, hypertension, GERD, and aortic atherosclerosis here today with her husband complaining of RUQ pain.  She has been experiencing RUQ abdominal pain for the past two weeks. The pain is mild and constant, with episodes of increased intensity, primarily triggered by palpation. It does not worsen with deep breathing or eating. The pain was particularly severe one night when lying on her left side, but less so when lying on her right side. She noted that one time her pain worsened after drinking two margaritas, though she is unsure if this was the case during previous episodes.  She recalls a similar episode of pain in 2021, which was associated with eating and resulted in nausea and vomiting. At that time, she was diagnosed with Helicobacter pylori infection and treated accordingly; symptoms resolved.  Her past medical history includes abnormal liver enzymes and hepatic steatosis. Abdominal US  on 06/20/24:  1. No acute abnormality identified. 2. Increased echotexture of the liver. This is a nonspecific finding but can be seen in fatty infiltration of liver.  08/2022 CT renal stone study: Geographic fatty sparing in the subcapsular anterior liver. No discrete liver lesion on this unenhanced exam. Small calcified gallstone. No gallbladder inflammation. No biliary dilatation.  Had hepatic done at her rheumatologist's office 05/2024 AST 42 and ALT 55.  No nausea, vomiting, fever, chills, cough, or difficulty breathing. There is no numbness or tingling in the area of pain.   She has been following a diet low in sugar and refined carbohydrates.  She  is planning to move to [facility] tomorrow for her husband new job. Needs refills on her antihypertensive medications. She is on Amlodipine -Benazepril  5-20 mg daily.  Review of Systems  Constitutional:  Negative for activity change, appetite change, chills and fever.  HENT:  Negative for sore throat and trouble swallowing.   Respiratory:  Negative for shortness of breath and wheezing.   Cardiovascular:  Negative for chest pain, palpitations and leg swelling.  Gastrointestinal:  Negative for blood in stool and vomiting.  Genitourinary:  Negative for decreased urine volume, dysuria and hematuria.  Skin:  Negative for rash.  Neurological:  Negative for syncope, weakness and headaches.  See other pertinent positives and negatives in HPI.  Current Outpatient Medications on File Prior to Visit  Medication Sig Dispense Refill   acetaminophen  (TYLENOL ) 500 MG tablet Take 1,000 mg by mouth daily as needed for moderate pain.     ibuprofen  (ADVIL ) 800 MG tablet Take 1 tablet 3 times a day by oral route.     MAGNESIUM  PO Take by mouth.     ORENCIA CLICKJECT 125 MG/ML SOAJ 125mg  Subcutaneous once a week for 90 days     Vitamin D , Ergocalciferol , (DRISDOL ) 1.25 MG (50000 UNIT) CAPS capsule TAKE 1 CAPSULE BY MOUTH every 10 days. 12 capsule 1   estradiol  (VIVELLE -DOT) 0.025 MG/24HR PLACE 1 PATCH ONTO THE SKIN 2 TIMES PER WEEK (Patient not taking: Reported on 09/13/2024) 8 patch 2   progesterone  (PROMETRIUM ) 100 MG capsule Take 1 capsule (100 mg total) by mouth daily. (Patient not taking: Reported on 09/13/2024) 90 capsule 0  No current facility-administered medications on file prior to visit.    Past Medical History:  Diagnosis Date   Bronchitis    Cataract    had sx    Diabetes mellitus without complication (HCC)    Fatty liver    Hypertension    Rheumatoid arthritis (HCC)    Thyroid  disease    UTI (urinary tract infection)    Allergies  Allergen Reactions   Morphine  And Codeine Other (See  Comments)    Fast heart beat   Baricitinib Rash   Ciprofloxacin Rash   Oxycodone  Palpitations   Tape Dermatitis    Social History   Socioeconomic History   Marital status: Married    Spouse name: Not on file   Number of children: Not on file   Years of education: Not on file   Highest education level: Not on file  Occupational History   Not on file  Tobacco Use   Smoking status: Former    Current packs/day: 0.00    Average packs/day: 0.8 packs/day for 25.0 years (18.8 ttl pk-yrs)    Types: Cigarettes    Start date: 10    Quit date: 2010    Years since quitting: 15.8   Smokeless tobacco: Never  Vaping Use   Vaping status: Never Used  Substance and Sexual Activity   Alcohol use: No    Alcohol/week: 0.0 standard drinks of alcohol   Drug use: No   Sexual activity: Yes    Partners: Male    Birth control/protection: Surgical    Comment: menarche 58yo, 1st intercourse- 20, partners- 2, btl  Other Topics Concern   Not on file  Social History Narrative   Right handed   One story   No caffeine x 2 weeks   Social Drivers of Corporate investment banker Strain: Not on file  Food Insecurity: No Food Insecurity (08/31/2022)   Hunger Vital Sign    Worried About Running Out of Food in the Last Year: Never true    Ran Out of Food in the Last Year: Never true  Transportation Needs: No Transportation Needs (08/31/2022)   PRAPARE - Administrator, Civil Service (Medical): No    Lack of Transportation (Non-Medical): No  Physical Activity: Not on file  Stress: Not on file  Social Connections: Unknown (05/07/2022)   Received from [facility]   Social Network    Social Network: Not on file   Vitals:   10/12/24 0727  BP: 122/80  Pulse: 72  Resp: 16  Temp: 97.9 F (36.6 C)  SpO2: 98%   Body mass index is 26 kg/m.  Physical Exam Vitals and nursing note reviewed.  Constitutional:      General: She is not in acute distress.    Appearance: She is  well-developed.  HENT:     Head: Normocephalic and atraumatic.     Mouth/Throat:     Mouth: Mucous membranes are moist.  Eyes:     Conjunctiva/sclera: Conjunctivae normal.  Cardiovascular:     Rate and Rhythm: Normal rate and regular rhythm.     Heart sounds: No murmur heard. Pulmonary:     Effort: Pulmonary effort is normal. No respiratory distress.     Breath sounds: Normal breath sounds.  Abdominal:     Palpations: Abdomen is soft. There is no hepatomegaly or mass.     Tenderness: There is abdominal tenderness in the right upper quadrant. There is no guarding or rebound.  Musculoskeletal:  Right lower leg: No edema.     Left lower leg: No edema.  Skin:    General: Skin is warm.     Findings: No erythema or rash.  Neurological:     General: No focal deficit present.     Mental Status: She is alert and oriented to person, place, and time.     Gait: Gait normal.  Psychiatric:        Mood and Affect: Mood and affect normal.   ASSESSMENT AND PLAN:  Ms. Leanora was seen today for RUQ pain. Lab Results  Component Value Date   WBC 6.7 10/12/2024   HGB 12.9 10/12/2024   HCT 39.3 10/12/2024   MCV 87.1 10/12/2024   PLT 315.0 10/12/2024   Lab Results  Component Value Date   ALT 22 10/12/2024   AST 17 10/12/2024   ALKPHOS 84 10/12/2024   BILITOT 0.6 10/12/2024   Lab Results  Component Value Date   NA 139 10/12/2024   CL 104 10/12/2024   K 4.0 10/12/2024   CO2 27 10/12/2024   BUN 12 10/12/2024   CREATININE 0.73 10/12/2024   GFR 92.16 10/12/2024   CALCIUM 9.3 10/12/2024   PHOS 3.5 08/29/2024   ALBUMIN 4.3 10/12/2024   GLUCOSE 93 10/12/2024   RUQ abdominal pain We discussed possible etiologies, including cholelithiasis, right lower lung process, musculoskeletal among some. Small gallstone seen on abdominal CT in 08/2022. We could consider surgical consultation to discuss cholecystectomy but she is leaving to [facility]. Recommend to establish with new  PCP and discuss this problem. Clearly instructed about warning signs.  -     DG Chest 2 View; Future  Elevated transaminase level Chronic. Hx of hepatic steatosis. Continue low carb and low fat diet.  -     CBC with Differential/Platelet; Future -     Hepatic function panel; Future  Hypertension, essential, benign Assessment & Plan: BP adequately controlled. Continue amlodipine -benazepril  5-20 mg daily and low-salt diet and low salt diet. Monitor BP regularly. Eye exam is current. Planning on moving to [facility], so she will be establishing with new PCP.  Orders: -     Basic metabolic panel with GFR; Future -     amLODIPine  Besy-Benazepril  HCl; Take 1 capsule by mouth daily.  Dispense: 90 capsule; Refill: 2   Return if symptoms worsen or fail to improve, for Moving to [facility].  Chastidy Ranker G. [place], MD  [facility]. [place] office.

## 2024-10-12 NOTE — Patient Instructions (Signed)
 SABRA

## 2024-10-13 ENCOUNTER — Ambulatory Visit: Payer: Self-pay | Admitting: Family Medicine

## 2024-10-15 ENCOUNTER — Ambulatory Visit: Admitting: Skilled Nursing Facility1

## 2024-11-03 ENCOUNTER — Other Ambulatory Visit: Payer: Self-pay | Admitting: Family Medicine

## 2024-11-03 DIAGNOSIS — I10 Essential (primary) hypertension: Secondary | ICD-10-CM

## 2025-09-17 ENCOUNTER — Ambulatory Visit: Admitting: Radiology
